# Patient Record
Sex: Female | Born: 1940 | Race: Black or African American | Hispanic: No | Marital: Single | Smoking: Former smoker
Health system: Southern US, Community
[De-identification: ages and names within clinical notes are randomized; demographics above are authoritative.]

## PROBLEM LIST (undated history)

## (undated) DIAGNOSIS — R7303 Prediabetes: Secondary | ICD-10-CM

## (undated) DIAGNOSIS — J45909 Unspecified asthma, uncomplicated: Secondary | ICD-10-CM

## (undated) DIAGNOSIS — R609 Edema, unspecified: Secondary | ICD-10-CM

## (undated) DIAGNOSIS — M109 Gout, unspecified: Secondary | ICD-10-CM

## (undated) DIAGNOSIS — I10 Essential (primary) hypertension: Secondary | ICD-10-CM

## (undated) HISTORY — DX: Unspecified asthma, uncomplicated: J45.909

## (undated) HISTORY — PX: ABDOMINAL HYSTERECTOMY: SHX81

---

## 1997-07-04 ENCOUNTER — Emergency Department (HOSPITAL_COMMUNITY): Admission: EM | Admit: 1997-07-04 | Discharge: 1997-07-04 | Payer: Self-pay | Admitting: Emergency Medicine

## 1998-05-13 ENCOUNTER — Other Ambulatory Visit: Admission: RE | Admit: 1998-05-13 | Discharge: 1998-05-13 | Payer: Self-pay | Admitting: Obstetrics and Gynecology

## 1998-07-15 ENCOUNTER — Emergency Department (HOSPITAL_COMMUNITY): Admission: EM | Admit: 1998-07-15 | Discharge: 1998-07-15 | Payer: Self-pay | Admitting: Emergency Medicine

## 1998-07-16 ENCOUNTER — Encounter: Payer: Self-pay | Admitting: Emergency Medicine

## 1999-11-14 ENCOUNTER — Encounter: Payer: Self-pay | Admitting: Obstetrics and Gynecology

## 1999-11-14 ENCOUNTER — Encounter: Admission: RE | Admit: 1999-11-14 | Discharge: 1999-11-14 | Payer: Self-pay | Admitting: Obstetrics and Gynecology

## 2000-04-25 ENCOUNTER — Other Ambulatory Visit: Admission: RE | Admit: 2000-04-25 | Discharge: 2000-04-25 | Payer: Self-pay | Admitting: Obstetrics and Gynecology

## 2000-07-26 ENCOUNTER — Encounter: Admission: RE | Admit: 2000-07-26 | Discharge: 2000-07-26 | Payer: Self-pay | Admitting: *Deleted

## 2000-07-26 ENCOUNTER — Encounter: Payer: Self-pay | Admitting: *Deleted

## 2000-12-20 ENCOUNTER — Emergency Department (HOSPITAL_COMMUNITY): Admission: EM | Admit: 2000-12-20 | Discharge: 2000-12-21 | Payer: Self-pay | Admitting: Emergency Medicine

## 2000-12-21 ENCOUNTER — Encounter: Payer: Self-pay | Admitting: Emergency Medicine

## 2005-08-13 ENCOUNTER — Emergency Department (HOSPITAL_COMMUNITY): Admission: EM | Admit: 2005-08-13 | Discharge: 2005-08-13 | Payer: Self-pay | Admitting: Emergency Medicine

## 2006-08-28 ENCOUNTER — Encounter: Admission: RE | Admit: 2006-08-28 | Discharge: 2006-08-28 | Payer: Self-pay | Admitting: Internal Medicine

## 2008-01-31 ENCOUNTER — Emergency Department (HOSPITAL_COMMUNITY): Admission: EM | Admit: 2008-01-31 | Discharge: 2008-01-31 | Payer: Self-pay | Admitting: Emergency Medicine

## 2009-02-18 ENCOUNTER — Encounter: Admission: RE | Admit: 2009-02-18 | Discharge: 2009-02-18 | Payer: Self-pay | Admitting: Internal Medicine

## 2009-02-24 ENCOUNTER — Encounter: Admission: RE | Admit: 2009-02-24 | Discharge: 2009-02-24 | Payer: Self-pay | Admitting: Internal Medicine

## 2010-04-11 ENCOUNTER — Other Ambulatory Visit: Payer: Self-pay | Admitting: Internal Medicine

## 2010-04-11 DIAGNOSIS — R221 Localized swelling, mass and lump, neck: Secondary | ICD-10-CM

## 2010-04-12 ENCOUNTER — Other Ambulatory Visit: Payer: Self-pay | Admitting: Internal Medicine

## 2010-04-12 DIAGNOSIS — Z1231 Encounter for screening mammogram for malignant neoplasm of breast: Secondary | ICD-10-CM

## 2010-04-19 ENCOUNTER — Ambulatory Visit: Payer: Self-pay

## 2010-04-19 ENCOUNTER — Ambulatory Visit
Admission: RE | Admit: 2010-04-19 | Discharge: 2010-04-19 | Disposition: A | Discharge status: Home / Self Care | Payer: Medicare Other | Source: Ambulatory Visit | Attending: Internal Medicine | Admitting: Internal Medicine

## 2010-04-19 ENCOUNTER — Other Ambulatory Visit: Payer: Self-pay | Admitting: Internal Medicine

## 2010-04-19 DIAGNOSIS — Z139 Encounter for screening, unspecified: Secondary | ICD-10-CM

## 2010-04-19 DIAGNOSIS — R221 Localized swelling, mass and lump, neck: Secondary | ICD-10-CM

## 2010-07-12 ENCOUNTER — Emergency Department (HOSPITAL_COMMUNITY)
Admission: EM | Admit: 2010-07-12 | Discharge: 2010-07-12 | Disposition: A | Discharge status: Home / Self Care | Payer: Medicare Other | Attending: Emergency Medicine | Admitting: Emergency Medicine

## 2010-07-12 ENCOUNTER — Emergency Department (HOSPITAL_COMMUNITY): Payer: Medicare Other

## 2010-07-12 DIAGNOSIS — R51 Headache: Secondary | ICD-10-CM | POA: Insufficient documentation

## 2010-07-12 DIAGNOSIS — I10 Essential (primary) hypertension: Secondary | ICD-10-CM | POA: Insufficient documentation

## 2010-07-12 DIAGNOSIS — H81319 Aural vertigo, unspecified ear: Secondary | ICD-10-CM | POA: Insufficient documentation

## 2010-07-12 LAB — CBC
HCT: 37.9 % (ref 36.0–46.0)
Hemoglobin: 12.1 g/dL (ref 12.0–15.0)
MCH: 29.4 pg (ref 26.0–34.0)
MCHC: 31.9 g/dL (ref 30.0–36.0)
MCV: 92.2 fL (ref 78.0–100.0)
Platelets: 248 10*3/uL (ref 150–400)
RBC: 4.11 MIL/uL (ref 3.87–5.11)
RDW: 13.3 % (ref 11.5–15.5)
WBC: 7.5 10*3/uL (ref 4.0–10.5)

## 2010-07-12 LAB — DIFFERENTIAL
Basophils Absolute: 0 10*3/uL (ref 0.0–0.1)
Basophils Relative: 0 % (ref 0–1)
Eosinophils Absolute: 0.1 10*3/uL (ref 0.0–0.7)
Eosinophils Relative: 1 % (ref 0–5)
Lymphocytes Relative: 16 % (ref 12–46)
Lymphs Abs: 1.2 10*3/uL (ref 0.7–4.0)
Monocytes Absolute: 0.5 10*3/uL (ref 0.1–1.0)
Monocytes Relative: 7 % (ref 3–12)
Neutro Abs: 5.7 10*3/uL (ref 1.7–7.7)
Neutrophils Relative %: 77 % (ref 43–77)

## 2010-07-12 LAB — URINALYSIS, ROUTINE W REFLEX MICROSCOPIC
Bilirubin Urine: NEGATIVE
Glucose, UA: NEGATIVE mg/dL
Hgb urine dipstick: NEGATIVE
Ketones, ur: NEGATIVE mg/dL
Leukocytes, UA: NEGATIVE
Nitrite: NEGATIVE
Protein, ur: NEGATIVE mg/dL
Specific Gravity, Urine: 1.006 (ref 1.005–1.030)
Urobilinogen, UA: 0.2 mg/dL (ref 0.0–1.0)
pH: 7.5 (ref 5.0–8.0)

## 2010-07-12 LAB — POCT I-STAT, CHEM 8
BUN: 11 mg/dL (ref 6–23)
Calcium, Ion: 1.15 mmol/L (ref 1.12–1.32)
Chloride: 101 mEq/L (ref 96–112)
Creatinine, Ser: 1 mg/dL (ref 0.50–1.10)
Glucose, Bld: 104 mg/dL — ABNORMAL HIGH (ref 70–99)
HCT: 40 % (ref 36.0–46.0)
Hemoglobin: 13.6 g/dL (ref 12.0–15.0)
Potassium: 3.8 mEq/L (ref 3.5–5.1)
Sodium: 141 mEq/L (ref 135–145)
TCO2: 31 mmol/L (ref 0–100)

## 2011-09-12 ENCOUNTER — Ambulatory Visit: Payer: Self-pay | Admitting: Obstetrics and Gynecology

## 2014-01-04 ENCOUNTER — Emergency Department (HOSPITAL_COMMUNITY)
Admission: EM | Admit: 2014-01-04 | Discharge: 2014-01-04 | Disposition: A | Discharge status: Home / Self Care | Payer: PRIVATE HEALTH INSURANCE | Attending: Emergency Medicine | Admitting: Emergency Medicine

## 2014-01-04 ENCOUNTER — Encounter (HOSPITAL_COMMUNITY): Payer: Self-pay

## 2014-01-04 ENCOUNTER — Emergency Department (HOSPITAL_COMMUNITY): Payer: PRIVATE HEALTH INSURANCE

## 2014-01-04 DIAGNOSIS — I10 Essential (primary) hypertension: Secondary | ICD-10-CM | POA: Diagnosis not present

## 2014-01-04 DIAGNOSIS — R05 Cough: Secondary | ICD-10-CM

## 2014-01-04 DIAGNOSIS — J209 Acute bronchitis, unspecified: Secondary | ICD-10-CM

## 2014-01-04 DIAGNOSIS — Z87891 Personal history of nicotine dependence: Secondary | ICD-10-CM | POA: Insufficient documentation

## 2014-01-04 DIAGNOSIS — R059 Cough, unspecified: Secondary | ICD-10-CM

## 2014-01-04 HISTORY — DX: Essential (primary) hypertension: I10

## 2014-01-04 LAB — CBC WITH DIFFERENTIAL/PLATELET
Basophils Absolute: 0 10*3/uL (ref 0.0–0.1)
Basophils Relative: 0 % (ref 0–1)
Eosinophils Absolute: 0.1 10*3/uL (ref 0.0–0.7)
Eosinophils Relative: 1 % (ref 0–5)
HCT: 39.4 % (ref 36.0–46.0)
Hemoglobin: 12.8 g/dL (ref 12.0–15.0)
Lymphocytes Relative: 25 % (ref 12–46)
Lymphs Abs: 1.6 10*3/uL (ref 0.7–4.0)
MCH: 30.3 pg (ref 26.0–34.0)
MCHC: 32.5 g/dL (ref 30.0–36.0)
MCV: 93.1 fL (ref 78.0–100.0)
Monocytes Absolute: 0.6 10*3/uL (ref 0.1–1.0)
Monocytes Relative: 9 % (ref 3–12)
Neutro Abs: 4.1 10*3/uL (ref 1.7–7.7)
Neutrophils Relative %: 65 % (ref 43–77)
Platelets: 190 10*3/uL (ref 150–400)
RBC: 4.23 MIL/uL (ref 3.87–5.11)
RDW: 13.4 % (ref 11.5–15.5)
WBC: 6.4 10*3/uL (ref 4.0–10.5)

## 2014-01-04 LAB — BASIC METABOLIC PANEL
Anion gap: 8 (ref 5–15)
BUN: 12 mg/dL (ref 6–23)
CO2: 31 mmol/L (ref 19–32)
Calcium: 9 mg/dL (ref 8.4–10.5)
Chloride: 102 mEq/L (ref 96–112)
Creatinine, Ser: 0.92 mg/dL (ref 0.50–1.10)
GFR calc Af Amer: 70 mL/min — ABNORMAL LOW (ref >90)
GFR calc non Af Amer: 61 mL/min — ABNORMAL LOW (ref >90)
Glucose, Bld: 112 mg/dL — ABNORMAL HIGH (ref 70–99)
Potassium: 3.3 mmol/L — ABNORMAL LOW (ref 3.5–5.1)
Sodium: 141 mmol/L (ref 135–145)

## 2014-01-04 LAB — I-STAT CG4 LACTIC ACID, ED: Lactic Acid, Venous: 0.56 mmol/L (ref 0.5–2.2)

## 2014-01-04 MED ORDER — SODIUM CHLORIDE 0.9 % IV SOLN
INTRAVENOUS | Status: DC
  Administered 2014-01-04: 15:00:00 via INTRAVENOUS

## 2014-01-04 MED ORDER — PREDNISONE 20 MG PO TABS
60.0000 mg | ORAL_TABLET | Freq: Once | ORAL | Status: AC
  Administered 2014-01-04: 60 mg via ORAL
  Filled 2014-01-04: qty 3

## 2014-01-04 MED ORDER — HYDROCODONE-ACETAMINOPHEN 5-325 MG PO TABS: ORAL_TABLET | ORAL | Status: DC

## 2014-01-04 MED ORDER — ALBUTEROL SULFATE HFA 108 (90 BASE) MCG/ACT IN AERS
2.0000 | INHALATION_SPRAY | Freq: Once | RESPIRATORY_TRACT | Status: AC
Start: 2014-01-04 — End: 2014-01-04
  Administered 2014-01-04: 2 via RESPIRATORY_TRACT
  Filled 2014-01-04: qty 6.7

## 2014-01-04 MED ORDER — AZITHROMYCIN 250 MG PO TABS: ORAL_TABLET | ORAL | Status: DC

## 2014-01-04 MED ORDER — IPRATROPIUM BROMIDE 0.02 % IN SOLN
0.5000 mg | Freq: Once | RESPIRATORY_TRACT | Status: AC
  Administered 2014-01-04: 0.5 mg via RESPIRATORY_TRACT
  Filled 2014-01-04: qty 2.5

## 2014-01-04 MED ORDER — PREDNISONE 50 MG PO TABS: ORAL_TABLET | ORAL | Status: DC

## 2014-01-04 MED ORDER — HYDROCOD POLST-CHLORPHEN POLST 10-8 MG/5ML PO LQCR
5.0000 mL | Freq: Once | ORAL | Status: AC
  Administered 2014-01-04: 5 mL via ORAL
  Filled 2014-01-04: qty 5

## 2014-01-04 NOTE — ED Provider Notes (Signed)
CSN: [id number]     Arrival date & time 01/04/14  1308 History  This chart was scribed for non-physician practitioner, Sherry EmeryNicole Kalinda Romaniello, PA-C, working with Sherry MochaBlair Walden, MD, by Sherry Wood, ED Scribe. This patient was seen in room WTR6/WTR6 and the patient's care was started at 2:19 PM.    Chief Complaint  Patient presents with  . Cough    The history is provided by the patient. No language interpreter was used.    HPI Comments: Sherry Wood is a 73 y.o. female who presents to the Emergency Department complaining of a productive cough that began 4 days ago. She initially felt dizzy the day before; the next day, she began to cough and have "hot sweats." A couple days later, patient had one episode of epistaxis. She complains of associated chest congestion, rhinorrhea, reduced ability to taste, and pain on "the roof of her mouth" that has resolved. She endorses some nausea and one episode of vomiting following ingestion of ginger ale. She denies blood thinner use. She denies chest pain, SOB, fever, and chills. Her son drove her to the ED here today.   Past Medical History  Diagnosis Date  . Hypertension    Past Surgical History  Procedure Laterality Date  . Abdominal hysterectomy     History reviewed. No pertinent family history. History  Substance Use Topics  . Smoking status: Former Games developermoker  . Smokeless tobacco: Not on file  . Alcohol Use: No   OB History    No data available     Review of Systems  10 systems reviewed and found to be negative, except as noted in the HPI.   Allergies  Review of patient's allergies indicates no known allergies.  Home Medications   Prior to Admission medications   Medication Sig Start Date End Date Taking? Authorizing Provider  azithromycin (ZITHROMAX Z-PAK) 250 MG tablet 2 po day one, then 1 daily x 4 days 01/04/14   Sherry ReiningNicole Satonya Lux, PA-C  HYDROcodone-acetaminophen (NORCO/VICODIN) 5-325 MG per tablet Take 1-2 tablets by mouth every 6  hours as needed for pain and/or cough. 01/04/14   Sherry Lute, PA-C  predniSONE (DELTASONE) 50 MG tablet Take 1 tablet daily with breakfast 01/04/14   Sherry Keadle, PA-C   BP 133/97 mmHg  Pulse 72  Temp(Src) 98.7 F (37.1 C) (Oral)  Resp 20  SpO2 95% Physical Exam  Constitutional: She is oriented to person, place, and time. She appears well-developed and well-nourished. No distress.  HENT:  Head: Normocephalic and atraumatic.  Mouth/Throat: Oropharynx is clear and moist.  Eyes: Conjunctivae and EOM are normal. Pupils are equal, round, and reactive to light.  Neck: Normal range of motion. Neck supple. No tracheal deviation present.  Cardiovascular: Normal rate, regular rhythm and intact distal pulses.   Pulmonary/Chest: Effort normal and breath sounds normal. No respiratory distress. She has no wheezes. She has no rales. She exhibits no tenderness.  Abdominal: Soft. Bowel sounds are normal. She exhibits no distension and no mass. There is no tenderness. There is no rebound and no guarding.  Musculoskeletal: Normal range of motion. She exhibits no tenderness.  Neurological: She is alert and oriented to person, place, and time.  Skin: Skin is warm and dry.  Psychiatric: She has a normal mood and affect. Her behavior is normal.  Nursing note and vitals reviewed.   ED Course  Procedures (including critical care time)  DIAGNOSTIC STUDIES: Oxygen Saturation is 94% on room air, adequate by my interpretation.    COORDINATION  OF CARE: 2:22 PM - Discussed treatment plan with pt at bedside which includes blood tests, IV fluids, and narcotic cough medication, and pt agreed to plan.   Labs Review Labs Reviewed  BASIC METABOLIC PANEL - Abnormal; Notable for the following:    Potassium 3.3 (*)    Glucose, Bld 112 (*)    GFR calc non Af Amer 61 (*)    GFR calc Af Amer 70 (*)    All other components within normal limits  CBC WITH DIFFERENTIAL  I-STAT CG4 LACTIC ACID, ED     Imaging Review Dg Chest 2 View  01/04/2014   CLINICAL DATA:  Productive cough for 5 days, shortness of breath  EXAM: CHEST  2 VIEW  COMPARISON:  08/13/2005  FINDINGS: The heart size and mediastinal contours are within normal limits. Both lungs are clear. The visualized skeletal structures are unremarkable.  IMPRESSION: No active cardiopulmonary disease.   Electronically Signed   By: Elige KoHetal  Patel   On: 01/04/2014 14:02     EKG Interpretation None      MDM   Final diagnoses:  Acute bronchitis, unspecified organism   Filed Vitals:   01/04/14 1339 01/04/14 1413 01/04/14 1600 01/04/14 1649  BP: 133/97     Pulse: 78  67 72  Temp: 98.7 F (37.1 C)     TempSrc: Oral     Resp:  16  20  SpO2: 94%  91% 95%    Medications  0.9 %  sodium chloride infusion ( Intravenous Stopped 01/04/14 1701)  chlorpheniramine-HYDROcodone (TUSSIONEX) 10-8 MG/5ML suspension 5 mL (5 mLs Oral Given 01/04/14 1455)  predniSONE (DELTASONE) tablet 60 mg (60 mg Oral Given 01/04/14 1617)  ipratropium (ATROVENT) nebulizer solution 0.5 mg (0.5 mg Nebulization Given 01/04/14 1618)  albuterol (PROVENTIL HFA;VENTOLIN HFA) 108 (90 BASE) MCG/ACT inhaler 2 puff (2 puffs Inhalation Given 01/04/14 1704)    Sherry Wood is a pleasant 73 y.o. female presenting with productive cough, subjective fever and chills. Chest x-ray does not show any infiltrate. Patient has no leukocytosis, lactic acid is normal. Will treat for acute bronchitis.  This is a shared visit with the attending physician who personally evaluated the patient and agrees with the care plan.   At discharge patient is found to have a oxygen saturation of 89-90% while resting. She will be given prednisone (she is not a diabetic) albuterol nebulizer treatment will ambulate her with pulse ox determine if her hypoxia is appropriate for her to be discharged home.  Pulse ox remained steady at 95% while patient ambulated.  Evaluation does not show pathology that  would require ongoing emergent intervention or inpatient treatment. Pt is hemodynamically stable and mentating appropriately. Discussed findings and plan with patient/guardian, who agrees with care plan. All questions answered. Return precautions discussed and outpatient follow up given.   Discharge Medication List as of 01/04/2014  3:55 PM    START taking these medications   Details  azithromycin (ZITHROMAX Z-PAK) 250 MG tablet 2 po day one, then 1 daily x 4 days, Print    HYDROcodone-acetaminophen (NORCO/VICODIN) 5-325 MG per tablet Take 1-2 tablets by mouth every 6 hours as needed for pain and/or cough., Print         I personally performed the services described in this documentation, which was scribed in my presence. The recorded information has been reviewed and is accurate.       Sherry Emeryicole Dellar Traber, PA-C 01/04/14 1813  Sherry MochaBlair Walden, MD 01/05/14 [phone]

## 2014-01-04 NOTE — ED Notes (Signed)
[facility], infusing well

## 2014-01-04 NOTE — ED Notes (Signed)
PA advised of persistent O2 sat of 89-91. O2 2l initiated

## 2014-01-04 NOTE — ED Notes (Signed)
Cough/congestion since Thursday.  Unknown for fever.  White sputum

## 2014-01-04 NOTE — Discharge Instructions (Signed)
Take vicodin for breakthrough pain, do not drink alcohol, drive, care for children or do other critical tasks while taking vicodin. ° °Please be very careful not to fall! °The pain medication puts you at risk for falls. Please rest as much as possible and try to not stay alone.  ° °Please follow with your primary care doctor in the next 2 days for a check-up. They must obtain records for further management.  ° °Do not hesitate to return to the Emergency Department for any new, worsening or concerning symptoms.  ° ° °

## 2015-03-23 ENCOUNTER — Other Ambulatory Visit: Payer: Self-pay | Admitting: Family Medicine

## 2015-03-23 DIAGNOSIS — Z1231 Encounter for screening mammogram for malignant neoplasm of breast: Secondary | ICD-10-CM

## 2015-04-19 ENCOUNTER — Other Ambulatory Visit: Payer: Self-pay

## 2015-04-19 DIAGNOSIS — Z1231 Encounter for screening mammogram for malignant neoplasm of breast: Secondary | ICD-10-CM

## 2015-05-06 ENCOUNTER — Ambulatory Visit
Admission: RE | Admit: 2015-05-06 | Discharge: 2015-05-06 | Disposition: A | Discharge status: Home / Self Care | Payer: Medicare Other | Source: Ambulatory Visit

## 2015-05-06 DIAGNOSIS — Z1231 Encounter for screening mammogram for malignant neoplasm of breast: Secondary | ICD-10-CM

## 2015-08-25 ENCOUNTER — Other Ambulatory Visit (INDEPENDENT_AMBULATORY_CARE_PROVIDER_SITE_OTHER): Payer: Self-pay | Admitting: Otolaryngology

## 2015-08-25 DIAGNOSIS — K118 Other diseases of salivary glands: Secondary | ICD-10-CM

## 2015-09-05 ENCOUNTER — Ambulatory Visit
Admission: RE | Admit: 2015-09-05 | Discharge: 2015-09-05 | Disposition: A | Discharge status: Home / Self Care | Payer: Medicare Other | Source: Ambulatory Visit | Attending: Otolaryngology | Admitting: Otolaryngology

## 2015-09-05 DIAGNOSIS — K118 Other diseases of salivary glands: Secondary | ICD-10-CM

## 2015-09-05 MED ORDER — IOPAMIDOL (ISOVUE-300) INJECTION 61%
75.0000 mL | Freq: Once | INTRAVENOUS | Status: AC | PRN
  Administered 2015-09-05: 75 mL via INTRAVENOUS

## 2016-04-13 ENCOUNTER — Other Ambulatory Visit: Payer: Self-pay | Admitting: Family Medicine

## 2016-04-13 DIAGNOSIS — R5381 Other malaise: Secondary | ICD-10-CM

## 2016-04-13 DIAGNOSIS — Z1231 Encounter for screening mammogram for malignant neoplasm of breast: Secondary | ICD-10-CM

## 2016-04-18 ENCOUNTER — Other Ambulatory Visit: Payer: Self-pay | Admitting: Family Medicine

## 2016-04-18 DIAGNOSIS — E2839 Other primary ovarian failure: Secondary | ICD-10-CM

## 2016-05-25 ENCOUNTER — Ambulatory Visit: Payer: Medicare Other

## 2016-05-25 ENCOUNTER — Other Ambulatory Visit: Payer: Medicare Other

## 2016-06-07 ENCOUNTER — Other Ambulatory Visit: Payer: Medicare Other

## 2016-06-07 ENCOUNTER — Ambulatory Visit: Payer: Medicare Other

## 2016-06-08 ENCOUNTER — Ambulatory Visit
Admission: RE | Admit: 2016-06-08 | Discharge: 2016-06-08 | Disposition: A | Discharge status: Home / Self Care | Payer: Medicare Other | Source: Ambulatory Visit | Attending: Family Medicine | Admitting: Family Medicine

## 2016-06-08 DIAGNOSIS — Z1231 Encounter for screening mammogram for malignant neoplasm of breast: Secondary | ICD-10-CM

## 2016-06-08 DIAGNOSIS — E2839 Other primary ovarian failure: Secondary | ICD-10-CM

## 2016-10-11 ENCOUNTER — Emergency Department (HOSPITAL_COMMUNITY)
Admission: EM | Admit: 2016-10-11 | Discharge: 2016-10-11 | Disposition: A | Discharge status: Home / Self Care | Payer: Medicare Other | Attending: Emergency Medicine | Admitting: Emergency Medicine

## 2016-10-11 ENCOUNTER — Encounter (HOSPITAL_COMMUNITY): Payer: Self-pay | Admitting: Emergency Medicine

## 2016-10-11 DIAGNOSIS — Z87891 Personal history of nicotine dependence: Secondary | ICD-10-CM | POA: Diagnosis not present

## 2016-10-11 DIAGNOSIS — Z79899 Other long term (current) drug therapy: Secondary | ICD-10-CM | POA: Diagnosis not present

## 2016-10-11 DIAGNOSIS — M25461 Effusion, right knee: Secondary | ICD-10-CM | POA: Diagnosis not present

## 2016-10-11 DIAGNOSIS — Z91040 Latex allergy status: Secondary | ICD-10-CM | POA: Insufficient documentation

## 2016-10-11 DIAGNOSIS — I10 Essential (primary) hypertension: Secondary | ICD-10-CM | POA: Insufficient documentation

## 2016-10-11 DIAGNOSIS — M25561 Pain in right knee: Secondary | ICD-10-CM | POA: Diagnosis present

## 2016-10-11 NOTE — Discharge Instructions (Signed)
You may take over-the-counter medicine for symptomatic relief, such as Tylenol, or Motrin. Please limit acetaminophen (Tylenol) to 4000 mg and Ibuprofen (Motrin, Advil, etc.) to 2400 mg for a 24hr period. Please note that other over-the-counter medicine may contain acetaminophen or ibuprofen as a component of their ingredients.    

## 2016-10-11 NOTE — ED Triage Notes (Signed)
Pt with R knee and RLE pain x several days. Pt denies injury. States pain started behind the knee and medial side of knee.Pt has attempted OTC pain meds, no relief. Pt states pain and swelling increases after ambulation.

## 2016-10-11 NOTE — ED Provider Notes (Signed)
[facility]-EMERGENCY DEPT Provider Note   CSN: [id number] Arrival date & time: 10/11/16  1251     History   Chief Complaint Chief Complaint  Patient presents with  . Knee Pain    HPI Sherry Wood is a 76 y.o. female.  The history is provided by the patient.  Knee Pain   This is a new problem. Episode onset: 1-2 weeks. The problem occurs constantly. The pain is present in the right knee. The quality of the pain is described as aching. The pain is moderate. Pertinent negatives include no numbness, full range of motion, no stiffness, no tingling and no itching. She has tried OTC pain medications for the symptoms. The treatment provided moderate relief. There has been no history of extremity trauma.   Pt reports that she Has been taking care of her 56-year-old grandson that likes to "ride" on her leg like a horse while she rapidly flexes and extends her leg at the knee.  Past Medical History:  Diagnosis Date  . Hypertension     There are no active problems to display for this patient.   Past Surgical History:  Procedure Laterality Date  . ABDOMINAL HYSTERECTOMY      OB History    No data available       Home Medications    Prior to Admission medications   Medication Sig Start Date End Date Taking? Authorizing Provider  furosemide (LASIX) 20 MG tablet Take 20 mg by mouth daily. 12/13/15  Yes [provider]  metoprolol tartrate (LOPRESSOR) 100 MG tablet Take 100 mg by mouth 2 (two) times daily. 07/03/16  Yes [provider]  azithromycin (ZITHROMAX Z-PAK) 250 MG tablet 2 po day one, then 1 daily x 4 days Patient not taking: Reported on 10/11/2016 01/04/14   Pisciotta, Joni Reining, PA-C  HYDROcodone-acetaminophen (NORCO/VICODIN) 5-325 MG per tablet Take 1-2 tablets by mouth every 6 hours as needed for pain and/or cough. Patient not taking: Reported on 10/11/2016 01/04/14   Pisciotta, Joni Reining, PA-C  predniSONE (DELTASONE) 50 MG tablet Take 1 tablet daily with  breakfast Patient not taking: Reported on 10/11/2016 01/04/14   Pisciotta, Mardella Layman    Family History History reviewed. No pertinent family history.  Social History Social History  Substance Use Topics  . Smoking status: Former Games developer  . Smokeless tobacco: Not on file  . Alcohol use No     Allergies   Aloe vera; Latex; Naproxen; and Penicillins   Review of Systems Review of Systems  Musculoskeletal: Negative for stiffness.  Skin: Negative for itching.  Neurological: Negative for tingling and numbness.     Physical Exam Updated Vital Signs BP (!) 163/95 (BP Location: Left Arm)   Pulse 68   Temp 98.3 F (36.8 C) (Oral)   Resp 18   Ht  (1.6 m)   Wt 86.2 kg (190 lb)   SpO2 95%   BMI 33.66 kg/m   Physical Exam  Constitutional: She is oriented to person, place, and time. She appears well-developed and well-nourished. No distress.  HENT:  Head: Normocephalic and atraumatic.  Right Ear: External ear normal.  Left Ear: External ear normal.  Nose: Nose normal.  Eyes: Conjunctivae and EOM are normal. No scleral icterus.  Neck: Normal range of motion and phonation normal.  Cardiovascular: Normal rate and regular rhythm.   Pulmonary/Chest: Effort normal. No stridor. No respiratory distress.  Abdominal: She exhibits no distension.  Musculoskeletal: Normal range of motion. She exhibits no edema.  Right knee: She exhibits effusion. She exhibits normal range of motion, no ecchymosis, no deformity, no erythema, no LCL laxity, normal patellar mobility, no bony tenderness and no MCL laxity. No tenderness found. No medial joint line and no lateral joint line tenderness noted.  Neurological: She is alert and oriented to person, place, and time.  Skin: She is not diaphoretic.  Psychiatric: She has a normal mood and affect. Her behavior is normal.  Vitals reviewed.    ED Treatments / Results  Labs (all labs ordered are listed, but only abnormal results are  displayed) Labs Reviewed - No data to display  EKG  EKG Interpretation None       Radiology No results found.  Procedures Procedures (including critical care time)  Medications Ordered in ED Medications - No data to display   Initial Impression / Assessment and Plan / ED Course  I have reviewed the triage vital signs and the nursing notes.  Pertinent labs & imaging results that were available during my care of the patient were reviewed by me and considered in my medical decision making (see chart for details).     Evidence of knee effusion likely secondary to overuse. Patient denied any trauma. No fevers. Low suspicion of septic arthritis or gout. Low suspicion for DVT. No indication for imaging at this time. Recommend over-the-counter medicine.  The patient is safe for discharge with strict return precautions.   Final Clinical Impressions(s) / ED Diagnoses   Final diagnoses:  Effusion of right knee   Disposition: Discharge  Condition: Good  I have discussed the results, Dx and Tx plan with the patient who expressed understanding and agree(s) with the plan. Discharge instructions discussed at great length. The patient was given strict return precautions who verbalized understanding of the instructions. No further questions at time of discharge.    New Prescriptions   No medications on file    Follow Up: Macy Mis, MD [address] [place] [address] [phone]  Schedule an appointment as soon as possible for a visit in 2 weeks If symptoms do not improve or  worsen      Cardama, Amadeo Garnet, MD 10/11/16 1713

## 2016-10-11 NOTE — ED Notes (Signed)
Pt ambulatory and independent at discharge.  Verbalized understanding of discharge instructions 

## 2017-09-25 ENCOUNTER — Other Ambulatory Visit: Payer: Self-pay | Admitting: Family Medicine

## 2017-09-25 DIAGNOSIS — Z1231 Encounter for screening mammogram for malignant neoplasm of breast: Secondary | ICD-10-CM

## 2017-10-22 ENCOUNTER — Ambulatory Visit
Admission: RE | Admit: 2017-10-22 | Discharge: 2017-10-22 | Disposition: A | Discharge status: Home / Self Care | Payer: Medicare Other | Source: Ambulatory Visit | Attending: Family Medicine | Admitting: Family Medicine

## 2017-10-22 DIAGNOSIS — Z1231 Encounter for screening mammogram for malignant neoplasm of breast: Secondary | ICD-10-CM

## 2019-08-27 ENCOUNTER — Encounter: Payer: Self-pay | Admitting: *Deleted

## 2019-09-02 ENCOUNTER — Ambulatory Visit (INDEPENDENT_AMBULATORY_CARE_PROVIDER_SITE_OTHER): Payer: Medicare PPO | Admitting: Obstetrics & Gynecology

## 2019-09-02 ENCOUNTER — Encounter: Payer: Self-pay | Admitting: Obstetrics & Gynecology

## 2019-09-02 ENCOUNTER — Other Ambulatory Visit: Payer: Self-pay

## 2019-09-02 VITALS — BP 145/98 | HR 67 | Wt 190.1 lb

## 2019-09-02 DIAGNOSIS — L900 Lichen sclerosus et atrophicus: Secondary | ICD-10-CM

## 2019-09-02 DIAGNOSIS — M7918 Myalgia, other site: Secondary | ICD-10-CM | POA: Diagnosis not present

## 2019-09-02 MED ORDER — CLOBETASOL PROPIONATE 0.05 % EX OINT: 1.0000 "application " | TOPICAL_OINTMENT | Freq: Two times a day (BID) | CUTANEOUS | 0 refills | Status: DC

## 2019-09-02 MED ORDER — DICLOFENAC SODIUM 1 % EX GEL: 2.0000 g | Freq: Four times a day (QID) | CUTANEOUS | 1 refills | Status: DC

## 2019-09-02 NOTE — Patient Instructions (Signed)
Lichen Sclerosus Lichen sclerosus is a skin problem. It can happen on any part of the body. It happens most often in the anal or genital areas. It can cause itching and discomfort. Treatment can help to control symptoms. It can also help prevent scarring that may lead to other problems. What are the causes? The cause of this condition is not known. It is not passed from one person to another (not contagious). What increases the risk? This condition is more likely to develop in women. It most often occurs after menopause. What are the signs or symptoms? Symptoms of this condition include:  Thin, wrinkled, white areas on the skin.  Thickened white areas on the skin.  Red and swollen patches (lesions) on the skin.  Tears or cracks in the skin.  Bruising.  Blood blisters.  Very bad itching.  Pain, itching, or burning when peeing (urinating).  Trouble pooping (constipation). How is this diagnosed? This condition may be diagnosed with a physical exam. A sample of your skin may also be removed to be looked at under a microscope (biopsy). How is this treated? This condition may be treated with:  Creams or ointments (topical steroids) that are put on the skin in the affected areas. This is the most common treatment.  Medicines that are taken by mouth.  Surgery. This is only needed if the condition is very bad and is causing problems such as scarring. Follow these instructions at home:  Take or use over-the-counter and prescription medicines only as told by your doctor.  Use creams or ointments as told by your doctor.  Do not scratch the affected areas of skin.  If you are a woman, keep the vagina as clean and dry as you can.  Clean the affected area of skin gently with water. Avoid using rough towels or toilet paper.  Keep all follow-up visits as told by your doctor. This is important. Contact a doctor if:  Your redness, swelling, or pain gets worse.  You have fluid,  blood, or pus coming from the area.  You have new red and swollen patches on your skin.  You have a fever.  You have pain during sex. Summary  Lichen sclerosus is a skin problem. It can cause itching and discomfort.  This condition is usually treated with creams or ointments that are put on the skin in the affected areas.  Use medicines only as told by your doctor.  Do not scratch the affected areas of skin.  Keep all follow-up visits as told by your doctor. This is important. This information is not intended to replace advice given to you by your health care provider. Make sure you discuss any questions you have with your health care provider. Document Revised: 05/09/2017 Document Reviewed: 05/09/2017 Elsevier Patient Education  2020 [employer]  

## 2019-09-02 NOTE — Progress Notes (Signed)
Sherry Wood is an 79 y.o. female. G6P6 presents with worsened abd/pelvic pain and vulvar irritation. Regarding pain chronic worsened since 2018, dull aching, non radiating, noted in groin and back, not affected by movement, worsened when sitting standing for long periods of time, worsened as day progresses. Last imaging CTAP in 11/2018 was negative for pathology for pain. Denies vaginal bleeding, weight loss, early satiety. Family h/o breast, ovarian CA.  Vulvar itching/irritation for past few months intermittent, antifungal creams has not helped.   Pertinent Gynecological History: Menses: post-menopausal Contraception: status post hysterectomy DES exposure: unknown Blood transfusions: none Sexually transmitted diseases: no past history Previous GYN Procedures: none  OB History: G6, P6006   Menstrual History: No LMP recorded. Patient has had a hysterectomy.    Past Medical History:  Diagnosis Date  . Asthma   . Hypertension     Past Surgical History:  Procedure Laterality Date  . ABDOMINAL HYSTERECTOMY      Family History  Problem Relation Age of Onset  . Diabetes Mother     Social History:  reports that she has quit smoking. Her smoking use included cigarettes. She has never used smokeless tobacco. She reports that she does not drink alcohol and does not use drugs.  Allergies:  Allergies  Allergen Reactions  . Aloe Vera Rash  . Latex Rash  . Naproxen Nausea And Vomiting  . Penicillins Nausea Only    Has patient had a PCN reaction causing immediate rash, facial/tongue/throat swelling, SOB or lightheadedness with hypotension: Unknown Has patient had a PCN reaction causing severe rash involving mucus membranes or skin necrosis: unknown Has patient had a PCN reaction that required hospitalization: unknown Has patient had a PCN reaction occurring within the last 10 years: No If all of the above answers are "NO", then may proceed with Cephalosporin use    (Not in a  hospital admission)   Review of Systems  Constitutional: Negative for activity change, appetite change, fatigue and unexpected weight change.  HENT: Negative.   Eyes: Negative.   Respiratory: Negative.  Negative for chest tightness, shortness of breath and wheezing.   Cardiovascular: Negative.  Negative for chest pain and leg swelling.  Gastrointestinal: Positive for abdominal pain. Negative for abdominal distention, constipation, diarrhea, nausea and vomiting.  Endocrine: Negative.   Genitourinary: Positive for vaginal pain. Negative for dysuria and hematuria.  Musculoskeletal: Positive for back pain.  Neurological: Negative.  Negative for dizziness, weakness, light-headedness, numbness and headaches.  Hematological: Negative.   Psychiatric/Behavioral: Negative.  Negative for agitation, confusion and decreased concentration.    Blood pressure (!) 145/98, pulse 67, weight 190 lb 1.6 oz (86.2 kg). Physical Exam Vitals reviewed.  Constitutional:      Appearance: She is well-developed.  HENT:     Head: Normocephalic and atraumatic.  Eyes:     Pupils: Pupils are equal, round, and reactive to light.  Cardiovascular:     Rate and Rhythm: Normal rate.  Pulmonary:     Effort: Pulmonary effort is normal.  Abdominal:     General: There is no distension.     Palpations: Abdomen is soft.     Tenderness: There is no abdominal tenderness. There is no guarding.  Genitourinary:    Vagina: No vaginal discharge.     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  Skin:    General: Skin is warm and dry.  Neurological:     Mental  Status: She is alert and oriented to person, place, and time.  Psychiatric:        Behavior: Behavior normal.     No results found for this or any previous visit (from the past 24 hour(s)).  No results found.  Assessment/Plan: 89TM L4Y5 with pelvic pain and vulvar  irritation. Abd/pelv pain: msk pain it most likely with start with voltaren and reassess in a few months.  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 Rx clobetasol   Malachy Chamber 09/02/2019, 11:36 AM

## 2019-10-28 ENCOUNTER — Other Ambulatory Visit: Payer: Self-pay | Admitting: Family Medicine

## 2019-10-28 DIAGNOSIS — Z1231 Encounter for screening mammogram for malignant neoplasm of breast: Secondary | ICD-10-CM

## 2019-10-29 ENCOUNTER — Ambulatory Visit: Payer: Medicare PPO

## 2019-10-29 ENCOUNTER — Ambulatory Visit
Admission: RE | Admit: 2019-10-29 | Discharge: 2019-10-29 | Disposition: A | Discharge status: Home / Self Care | Payer: Medicare HMO | Source: Ambulatory Visit | Attending: Family Medicine | Admitting: Family Medicine

## 2019-10-29 ENCOUNTER — Other Ambulatory Visit: Payer: Self-pay

## 2019-10-29 DIAGNOSIS — Z1231 Encounter for screening mammogram for malignant neoplasm of breast: Secondary | ICD-10-CM

## 2020-02-07 ENCOUNTER — Observation Stay (HOSPITAL_COMMUNITY)
Admission: EM | Admit: 2020-02-07 | Discharge: 2020-02-07 | Disposition: A | Discharge status: Home / Self Care | Payer: Medicare HMO | Attending: Family Medicine | Admitting: Family Medicine

## 2020-02-07 ENCOUNTER — Emergency Department (HOSPITAL_COMMUNITY): Payer: Medicare HMO

## 2020-02-07 ENCOUNTER — Other Ambulatory Visit: Payer: Self-pay

## 2020-02-07 ENCOUNTER — Observation Stay (HOSPITAL_BASED_OUTPATIENT_CLINIC_OR_DEPARTMENT_OTHER): Payer: Medicare HMO

## 2020-02-07 ENCOUNTER — Encounter (HOSPITAL_COMMUNITY): Payer: Self-pay

## 2020-02-07 DIAGNOSIS — Z91040 Latex allergy status: Secondary | ICD-10-CM | POA: Insufficient documentation

## 2020-02-07 DIAGNOSIS — R55 Syncope and collapse: Secondary | ICD-10-CM

## 2020-02-07 DIAGNOSIS — Z79899 Other long term (current) drug therapy: Secondary | ICD-10-CM | POA: Diagnosis not present

## 2020-02-07 DIAGNOSIS — R0689 Other abnormalities of breathing: Secondary | ICD-10-CM | POA: Diagnosis not present

## 2020-02-07 DIAGNOSIS — J45909 Unspecified asthma, uncomplicated: Secondary | ICD-10-CM | POA: Insufficient documentation

## 2020-02-07 DIAGNOSIS — Z20822 Contact with and (suspected) exposure to covid-19: Secondary | ICD-10-CM | POA: Insufficient documentation

## 2020-02-07 DIAGNOSIS — Z87891 Personal history of nicotine dependence: Secondary | ICD-10-CM | POA: Diagnosis not present

## 2020-02-07 DIAGNOSIS — J9612 Chronic respiratory failure with hypercapnia: Secondary | ICD-10-CM | POA: Diagnosis not present

## 2020-02-07 DIAGNOSIS — R0902 Hypoxemia: Secondary | ICD-10-CM

## 2020-02-07 DIAGNOSIS — E662 Morbid (severe) obesity with alveolar hypoventilation: Secondary | ICD-10-CM | POA: Diagnosis present

## 2020-02-07 DIAGNOSIS — E876 Hypokalemia: Secondary | ICD-10-CM

## 2020-02-07 DIAGNOSIS — R42 Dizziness and giddiness: Secondary | ICD-10-CM | POA: Diagnosis present

## 2020-02-07 DIAGNOSIS — I10 Essential (primary) hypertension: Secondary | ICD-10-CM | POA: Diagnosis not present

## 2020-02-07 HISTORY — DX: Edema, unspecified: R60.9

## 2020-02-07 HISTORY — DX: Prediabetes: R73.03

## 2020-02-07 HISTORY — DX: Gout, unspecified: M10.9

## 2020-02-07 LAB — BLOOD GAS, ARTERIAL
Acid-Base Excess: 4.7 mmol/L — ABNORMAL HIGH (ref 0.0–2.0)
Acid-Base Excess: 4.4 mmol/L — ABNORMAL HIGH (ref 0.0–2.0)
Bicarbonate: 32 mmol/L — ABNORMAL HIGH (ref 20.0–28.0)
Bicarbonate: 31 mmol/L — ABNORMAL HIGH (ref 20.0–28.0)
FIO2: 28
FIO2: 21
O2 Saturation: 97.5 %
O2 Saturation: 84.7 %
Patient temperature: 98.6
Patient temperature: 98.6
pCO2 arterial: 62.2 mmHg — ABNORMAL HIGH (ref 32.0–48.0)
pCO2 arterial: 56.5 mmHg — ABNORMAL HIGH (ref 32.0–48.0)
pH, Arterial: 7.331 — ABNORMAL LOW (ref 7.350–7.450)
pH, Arterial: 7.358 (ref 7.350–7.450)
pO2, Arterial: 101 mmHg (ref 83.0–108.0)
pO2, Arterial: 53.7 mmHg — ABNORMAL LOW (ref 83.0–108.0)

## 2020-02-07 LAB — URINALYSIS, ROUTINE W REFLEX MICROSCOPIC
Bilirubin Urine: NEGATIVE
Glucose, UA: NEGATIVE mg/dL
Hgb urine dipstick: NEGATIVE
Ketones, ur: NEGATIVE mg/dL
Leukocytes,Ua: NEGATIVE
Nitrite: NEGATIVE
Protein, ur: NEGATIVE mg/dL
Specific Gravity, Urine: 1.006 (ref 1.005–1.030)
pH: 8 (ref 5.0–8.0)

## 2020-02-07 LAB — D-DIMER, QUANTITATIVE: D-Dimer, Quant: 2.4 ug/mL-FEU — ABNORMAL HIGH (ref 0.00–0.50)

## 2020-02-07 LAB — CBC WITH DIFFERENTIAL/PLATELET
Abs Immature Granulocytes: 0.01 10*3/uL (ref 0.00–0.07)
Basophils Absolute: 0 10*3/uL (ref 0.0–0.1)
Basophils Relative: 0 %
Eosinophils Absolute: 0.1 10*3/uL (ref 0.0–0.5)
Eosinophils Relative: 1 %
HCT: 44.2 % (ref 36.0–46.0)
Hemoglobin: 13.9 g/dL (ref 12.0–15.0)
Immature Granulocytes: 0 %
Lymphocytes Relative: 14 %
Lymphs Abs: 1.1 10*3/uL (ref 0.7–4.0)
MCH: 30.9 pg (ref 26.0–34.0)
MCHC: 31.4 g/dL (ref 30.0–36.0)
MCV: 98.2 fL (ref 80.0–100.0)
Monocytes Absolute: 0.6 10*3/uL (ref 0.1–1.0)
Monocytes Relative: 8 %
Neutro Abs: 6 10*3/uL (ref 1.7–7.7)
Neutrophils Relative %: 77 %
Platelets: 189 10*3/uL (ref 150–400)
RBC: 4.5 MIL/uL (ref 3.87–5.11)
RDW: 13.6 % (ref 11.5–15.5)
WBC: 7.9 10*3/uL (ref 4.0–10.5)
nRBC: 0 % (ref 0.0–0.2)

## 2020-02-07 LAB — BRAIN NATRIURETIC PEPTIDE: B Natriuretic Peptide: 66.5 pg/mL (ref 0.0–100.0)

## 2020-02-07 LAB — ECHOCARDIOGRAM COMPLETE
Area-P 1/2: 2.07 cm2
Height: 61 in
S' Lateral: 2.8 cm
Weight: 3040 oz

## 2020-02-07 LAB — BASIC METABOLIC PANEL
Anion gap: 9 (ref 5–15)
BUN: 13 mg/dL (ref 8–23)
CO2: 30 mmol/L (ref 22–32)
Calcium: 8.8 mg/dL — ABNORMAL LOW (ref 8.9–10.3)
Chloride: 99 mmol/L (ref 98–111)
Creatinine, Ser: 0.86 mg/dL (ref 0.44–1.00)
GFR, Estimated: >60 mL/min (ref >60)
Glucose, Bld: 122 mg/dL — ABNORMAL HIGH (ref 70–99)
Potassium: 3.3 mmol/L — ABNORMAL LOW (ref 3.5–5.1)
Sodium: 138 mmol/L (ref 135–145)

## 2020-02-07 LAB — MAGNESIUM: Magnesium: 2.2 mg/dL (ref 1.7–2.4)

## 2020-02-07 LAB — SARS CORONAVIRUS 2 BY RT PCR (HOSPITAL ORDER, PERFORMED IN ~~LOC~~ HOSPITAL LAB): SARS Coronavirus 2: NEGATIVE

## 2020-02-07 MED ORDER — ONDANSETRON HCL 4 MG/2ML IJ SOLN
4.0000 mg | Freq: Once | INTRAMUSCULAR | Status: AC
  Administered 2020-02-07: 4 mg via INTRAVENOUS
  Filled 2020-02-07: qty 2

## 2020-02-07 MED ORDER — METOPROLOL TARTRATE 25 MG PO TABS
100.0000 mg | ORAL_TABLET | Freq: Two times a day (BID) | ORAL | Status: DC
  Administered 2020-02-07: 100 mg via ORAL
  Filled 2020-02-07: qty 4

## 2020-02-07 MED ORDER — FUROSEMIDE 40 MG PO TABS
20.0000 mg | ORAL_TABLET | Freq: Every day | ORAL | Status: DC
  Administered 2020-02-07: 20 mg via ORAL
  Filled 2020-02-07: qty 1

## 2020-02-07 MED ORDER — IOHEXOL 350 MG/ML SOLN
100.0000 mL | Freq: Once | INTRAVENOUS | Status: AC | PRN
  Administered 2020-02-07: 80 mL via INTRAVENOUS

## 2020-02-07 MED ORDER — ACETAMINOPHEN 325 MG PO TABS: 650.0000 mg | ORAL_TABLET | Freq: Four times a day (QID) | ORAL | Status: DC | PRN

## 2020-02-07 MED ORDER — POTASSIUM CHLORIDE CRYS ER 20 MEQ PO TBCR
40.0000 meq | EXTENDED_RELEASE_TABLET | Freq: Once | ORAL | Status: AC
  Administered 2020-02-07: 40 meq via ORAL
  Filled 2020-02-07: qty 2

## 2020-02-07 MED ORDER — ENOXAPARIN SODIUM 40 MG/0.4ML ~~LOC~~ SOLN: 40.0000 mg | SUBCUTANEOUS | Status: DC

## 2020-02-07 MED ORDER — HYDRALAZINE HCL 25 MG PO TABS: 25.0000 mg | ORAL_TABLET | Freq: Four times a day (QID) | ORAL | Status: DC | PRN

## 2020-02-07 MED ORDER — ACETAMINOPHEN 650 MG RE SUPP: 650.0000 mg | Freq: Four times a day (QID) | RECTAL | Status: DC | PRN

## 2020-02-07 NOTE — Progress Notes (Signed)
Spoke with attending, Dr. Maryfrances Bunnell, will hold BiPAP at this time. RT available for further needs for this patient.

## 2020-02-07 NOTE — ED Provider Notes (Signed)
[facility]-EMERGENCY DEPT Provider Note   CSN: [id number] Arrival date & time: 02/07/20  6301     History Chief Complaint  Patient presents with  . Dizziness  . Nausea    Jenipher JUANELLE TRUEHEART is a 80 y.o. female.  HPI     This is a 80 year old female with a history of asthma and hypertension who presents with dizziness and nausea.  Patient reports that she has been up most of the night making wreaths.  This is not abnormal for her.  She states that she stood up to walk she had the bathroom when she felt dizzy.  She describes as lightheadedness and feeling off balance.  She denies room spinning dizziness.  She states "I felt like I might pass out."  She did not have any chest pain or shortness of breath.  She did not note any weakness, numbness, speech difficulty, vision changes.  She states that when sitting still she is asymptomatic.  She is without complaint right now.  No recent illnesses or fevers.  She reports cough.  No known sick exposures or Covid contacts.  She denies headache.  She states that she just continued to feel "weird" and wanted to be checked out. Past Medical History:  Diagnosis Date  . Asthma   . Hypertension     There are no problems to display for this patient.   Past Surgical History:  Procedure Laterality Date  . ABDOMINAL HYSTERECTOMY       OB History    Gravida  6   Para  1   Term  1   Preterm      AB      Living  6     SAB      IAB      Ectopic      Multiple      Live Births  5           Family History  Problem Relation Age of Onset  . Diabetes Mother     Social History   Tobacco Use  . Smoking status: Former Smoker    Types: Cigarettes  . Smokeless tobacco: Never Used  Vaping Use  . Vaping Use: Never used  Substance Use Topics  . Alcohol use: No  . Drug use: No    Home Medications Prior to Admission medications   Medication Sig Start Date End Date Taking? Authorizing Provider  albuterol  (VENTOLIN HFA) 108 (90 Base) MCG/ACT inhaler Inhale 1-2 puffs into the lungs every 6 (six) hours as needed for wheezing or shortness of breath.    [provider]  calcium-vitamin D (OSCAL WITH D) 500-200 MG-UNIT tablet Take 1 tablet by mouth.    [provider]  clobetasol ointment (TEMOVATE) 0.05 % Apply 1 application topically 2 (two) times daily. 09/02/19   Malachy Chamber, MD  diclofenac Sodium (VOLTAREN) 1 % GEL Apply 2 g topically 4 (four) times daily. 09/02/19   Malachy Chamber, MD  furosemide (LASIX) 20 MG tablet Take 20 mg by mouth daily. 12/13/15   [provider]  metoprolol tartrate (LOPRESSOR) 100 MG tablet Take 100 mg by mouth 2 (two) times daily. 07/03/16   [provider]    Allergies    Aloe vera, Latex, Naproxen, and Penicillins  Review of Systems   Review of Systems  Constitutional: Negative for fever.  Eyes: Negative for photophobia and visual disturbance.  Respiratory: Positive for cough. Negative for shortness of breath.  Cardiovascular: Negative for chest pain.  Gastrointestinal: Positive for nausea. Negative for abdominal pain and vomiting.  Genitourinary: Negative for dysuria.  Neurological: Positive for light-headedness. Negative for dizziness, seizures, syncope, weakness and headaches.  All other systems reviewed and are negative.   Physical Exam Updated Vital Signs BP (!) 169/98   Pulse 74   Temp 97.7 F (36.5 C) (Oral)   Resp 17   Ht 1.549 m (5\' 1" )   Wt 86.2 kg   SpO2 94%   BMI 35.90 kg/m   Physical Exam Vitals and nursing note reviewed.  Constitutional:      Appearance: She is well-developed and well-nourished. She is not ill-appearing.  HENT:     Head: Normocephalic and atraumatic.     Nose: Nose normal.     Mouth/Throat:     Mouth: Mucous membranes are dry.  Eyes:     Extraocular Movements: Extraocular movements intact.     Pupils: Pupils are equal, round, and reactive to light.     Comments: No  nystagmus noted  Cardiovascular:     Rate and Rhythm: Normal rate and regular rhythm.     Heart sounds: Normal heart sounds.  Pulmonary:     Effort: Pulmonary effort is normal. No respiratory distress.     Breath sounds: No wheezing.  Abdominal:     General: Bowel sounds are normal.     Palpations: Abdomen is soft.     Tenderness: There is no abdominal tenderness.  Musculoskeletal:     Cervical back: Neck supple.     Comments: 1+ bilateral lower extremity edema  Skin:    General: Skin is warm and dry.  Neurological:     Mental Status: She is alert and oriented to person, place, and time.     Comments: Cranial nerves II through XII intact, 5 out of 5 strength in all 4 extremities, no dysmetria to finger-nose-finger, no drift, gait testing deferred  Psychiatric:        Mood and Affect: Mood and affect and mood normal.     ED Results / Procedures / Treatments   Labs (all labs ordered are listed, but only abnormal results are displayed) Labs Reviewed  BASIC METABOLIC PANEL - Abnormal; Notable for the following components:      Result Value   Potassium 3.3 (*)    Glucose, Bld 122 (*)    Calcium 8.8 (*)    All other components within normal limits  URINALYSIS, ROUTINE W REFLEX MICROSCOPIC - Abnormal; Notable for the following components:   Color, Urine COLORLESS (*)    All other components within normal limits  SARS CORONAVIRUS 2 BY RT PCR (HOSPITAL ORDER, PERFORMED IN [facility])  CBC WITH DIFFERENTIAL/PLATELET  BRAIN NATRIURETIC PEPTIDE  D-DIMER, QUANTITATIVE (NOT AT [facility])    EKG EKG Interpretation  Date/Time:  Sunday February 07 2020 06:22:23 EST Ventricular Rate:  64 PR Interval:    QRS Duration: 91 QT Interval:  378 QTC Calculation: 390 R Axis:   31 Text Interpretation: Sinus rhythm Probable anteroseptal infarct, old Confirmed by [id number] (Ross Marcus) on 02/07/2020 6:43:20 AM   Radiology CT Head Wo Contrast  Result Date: 02/07/2020 CLINICAL  DATA:  Nonspecific dizziness with nausea beginning this morning EXAM: CT HEAD WITHOUT CONTRAST TECHNIQUE: Contiguous axial images were obtained from the base of the skull through the vertex without intravenous contrast. COMPARISON:  Or 07/12/2010 FINDINGS: Brain: No evidence of acute infarction, hemorrhage, hydrocephalus, extra-axial collection or mass lesion/mass effect. Mild chronic small  vessel disease in the deep cerebral white matter. Age normal brain volume. Scattered dural calcification/ossification which is incidental. Vascular: No hyperdense vessel or unexpected calcification. Skull: Normal. Negative for fracture or focal lesion. Sinuses/Orbits: No acute finding. Clear mastoid and middle ear spaces. IMPRESSION: No acute finding or explanation for symptoms. Electronically Signed   By: Marnee Spring M.D.   On: 02/07/2020 06:54    Procedures Procedures   Medications Ordered in ED Medications  ondansetron (ZOFRAN) injection 4 mg (4 mg Intravenous Given 02/07/20 3762)    ED Course  I have reviewed the triage vital signs and the nursing notes.  Pertinent labs & imaging results that were available during my care of the patient were reviewed by me and considered in my medical decision making (see chart for details).  Clinical Course as of 02/07/20 8315  Wynelle Link Feb 07, 2020  0721 Checked on patient.  She states she still feels funny and lightheaded.  O2 sats noted to be 89% on room air with a good waveform.  No history of smoking or COPD.  Breath sounds are clear.  No sick contacts or Covid exposures.  She has received 2 vaccinations.  We will send Covid testing, obtain chest x-ray, and send D-dimer.  Question hypoxia as cause? [CH]    Clinical Course User Index [CH] Markeda Narvaez, Mayer Masker, MD   MDM Rules/Calculators/A&P                          Patient presents with lightheadedness near syncope.  She is overall nontoxic and initial vital signs are notable for hypertension with a blood pressure  of 186/104.  She is afebrile and otherwise her vital signs are reassuring.  She denies vertiginous symptoms and her neurologic exam is normal.  Considerations include but not limited to, arrhythmia, dehydration, systemic illness.  Lower suspicion for stroke.  Given her blood pressure however, will obtain CT head to rule out hypertensive emergency.  Orthostatics are negative.  EKG shows no evidence of arrhythmia.  See clinical course above.  On recheck, noted to be hypoxic with a good waveform and clear breath sounds.  Additional work-up added.  Question whether hypoxia could be causing some of the patient's symptoms.  Patient signed out to oncoming provider.   Final Clinical Impression(s) / ED Diagnoses Final diagnoses:  None    Rx / DC Orders ED Discharge Orders    None       Demetri Kerman, Mayer Masker, MD 02/07/20 [id number]

## 2020-02-07 NOTE — Progress Notes (Signed)
ABG sent to lab at this time; ABG drawn on 2 L [place].

## 2020-02-07 NOTE — ED Triage Notes (Addendum)
Pt came in via POV with c/o dizziness and nausea. Pt states that she was up around 0100 and standing in the kitchen when she had a dizzy spell and nausea. Pt states she felt like she was going to pass out. Since then she has a couple of bowel movements. Pt states her head feels "weird" and her vision is blurred. Pt's feet are swollen bilat. No hx of CHF or CKD. Pt pressure is 186/104.

## 2020-02-07 NOTE — Progress Notes (Signed)
Patient removed from BiPAP at 1635 to RA; ABG to be drawn on RA per MD.

## 2020-02-07 NOTE — Progress Notes (Signed)
  Echocardiogram 2D Echocardiogram has been performed.  Leta Jungling M 02/07/2020, 2:06 PM

## 2020-02-07 NOTE — Progress Notes (Signed)
   02/07/20 1638  TOC ED Mini Assessment  Barrier interventions [facility] ED RNCM was contacted concerning arranging Trilogy Home Ventilator from the ED today.  Patient was admitted but will be discharged home from the ED.  CM noted patient's ABG to assess  eligibility, CM contacted [facility] who will initiate the process, CM made Dr. Maryfrances Bunnell aware that we will have to wait until tomorrow for insurance approval but [facility] team will follow up in the am.

## 2020-02-07 NOTE — Consult Note (Signed)
Consult Note  Patient Name: Sherry Wood     NUU:[id number]    DOB: [birth year]    DOA: 02/07/2020 PCP: Macy Mis, MD  Patient coming from: Home  Chief Complaint: Pre-syncope      HPI: Sherry Wood is a 80 y.o. F with hx morbid obesity, HTN, pre-diabetes who presents with a pre-syncopal episode.  Patient was in her usual state of health (fully independent, lives alone, still drives, no known cognitive impairment) without recent symptoms until last night, she was up in her kitchen until 2AM making "wreaths" when she stood up to go to bed, felt lightheaded and weak, nearly passed out.  Her son came over and convinced her to go to the ER.  In the ER, heart rate and respirations normal, blood pressure 186/104, pulse ox 97% on room air initially.  Potassium 3.3, but glucose, renal function and other electrolytes normal.  WBC normal, no anemia.  Covid negative.  CT head unremarkable.  D-dimer was elevated, but CTA chest showed no PE, dissection, pneumonia, edema, atelectasis, effusion, or emphysema.  While being observed in the ER for several hours, the patient was noted to be hypoxic intermittently down to the mid 80s.  At other times her SPO2 was 94 to 99% on room air.  An ABG was drawn that showed SpO2 62 and bicarb 32.   Patient has no prior history of lung disease.  She has had no recent respiratory symptoms, no cough, wheezing, dyspnea on exertion, orthopnea, no sputum, PND.    She has never been diagnosed with sleep apnea or had PSG.  She has gained 100 lbs in the last 2 years, and has new leg swelling but no daytime sleepiness.           ROS: Review of Systems  Constitutional: Negative for chills, fever and malaise/fatigue.  Eyes: Negative for double vision.  Respiratory: Negative for cough, hemoptysis, sputum production, shortness of breath and wheezing.   Cardiovascular: Positive for leg swelling. Negative for chest pain, palpitations, orthopnea, claudication and PND.   Gastrointestinal: Negative for abdominal pain, blood in stool, constipation, diarrhea, melena and vomiting.  Genitourinary: Negative for dysuria, flank pain, frequency, hematuria and urgency.  Neurological: Negative for tingling, tremors, sensory change, speech change, focal weakness, seizures, loss of consciousness, weakness and headaches.  All other systems reviewed and are negative.         Past Medical History:  Diagnosis Date  . Asthma   . Gout   . Hypertension   . Parotid swelling    Intermittent since 2017, referred to ENT, thought maybe sialiadenitis  . Pre-diabetes     Past Surgical History:  Procedure Laterality Date  . ABDOMINAL HYSTERECTOMY      Social History: Patient lives alone.  The patient walks unassisted.  Remote former smoker, mild use history.  No alcohol use.  Still drives.  Allergies  Allergen Reactions  . Aloe Vera Rash  . Latex Rash  . Naproxen Nausea And Vomiting  . Penicillins Nausea Only    Has patient had a PCN reaction causing immediate rash, facial/tongue/throat swelling, SOB or lightheadedness with hypotension: Unknown Has patient had a PCN reaction causing severe rash involving mucus membranes or skin necrosis: unknown Has patient had a PCN reaction that required hospitalization: unknown Has patient had a PCN reaction occurring within the last 10 years: No If all of the above answers are "NO", then may proceed with Cephalosporin use    Family history: family history includes  Asthma in her sister; Cancer in her mother and sister; Diabetes in her mother and sister; Hypertension in her sister.  Prior to Admission medications   Medication Sig Start Date End Date Taking? Authorizing Provider  albuterol (VENTOLIN HFA) 108 (90 Base) MCG/ACT inhaler Inhale 1-2 puffs into the lungs every 6 (six) hours as needed for wheezing or shortness of breath.   Yes [provider]  calcium-vitamin D (OSCAL WITH D) 500-200 MG-UNIT tablet Take 1 tablet  by mouth daily with breakfast.   Yes [provider]  furosemide (LASIX) 20 MG tablet Take 20 mg by mouth daily. 12/13/15  Yes [provider]  metoprolol tartrate (LOPRESSOR) 100 MG tablet Take 100 mg by mouth 2 (two) times daily. 07/03/16  Yes [provider]  clobetasol ointment (TEMOVATE) 0.05 % Apply 1 application topically 2 (two) times daily. Patient not taking: Reported on 02/07/2020 09/02/19   Malachy Chamber, MD  diclofenac Sodium (VOLTAREN) 1 % GEL Apply 2 g topically 4 (four) times daily. Patient not taking: Reported on 02/07/2020 09/02/19   Malachy Chamber, MD       Physical Exam: BP (!) 172/104 Comment: RN notified.  Pulse 61   Temp 97.7 F (36.5 C) (Oral)   Resp 14   Ht 5\' 1"  (1.549 m)   Wt 86.2 kg   SpO2 93%   BMI 35.90 kg/m  General appearance: Well-developed, adult female, alert and in no acute distress.   Eyes: Anicteric, conjunctiva pink, lids and lashes normal. PERRL.    ENT: No nasal deformity, discharge, epistaxis.  Hearing normal. OP moist without lesions.  Lips normal, dentures in place.  Mallampati 4.  Mild asymmetry of left parotid/submandibular, but no palpable mass or firmness to me. Neck: No neck masses.  Trachea midline.  No thyromegaly/tenderness. Lymph: No cervical or supraclavicular lymphadenopathy. Skin: Warm and dry.  No jaundice.  No suspicious rashes or lesions. Cardiac: RRR, nl S1-S2, no murmurs appreciated.  Capillary refill is brisk.  JVP normal.  1+ LE edema to mid shin.  Radial and DP pulses 2+ and symmetric. Respiratory: Normal respiratory rate and rhythm.  CTAB without rales or wheezes. Abdomen: Abdomen soft.  No TTP or guarding. No ascites, distension, hepatosplenomegaly.   MSK: No deformities or effusions of the large joints of the upper or lower extremities bilaterally.  No cyanosis or clubbing. Neuro: Cranial nerves 3-12 intact.  Sensation intact to light touch. Speech is fluent.  Muscle strength 5/5 and  symmetric in all extremities.    Psych: Sensorium intact and responding to questions, attention normal.  Behavior appropriate.  Affect normal.  Judgment and insight appear normal.     Labs on Admission:  I have personally reviewed following labs and imaging studies: CBC: Recent Labs  Lab 02/07/20 0630  WBC 7.9  NEUTROABS 6.0  HGB 13.9  HCT 44.2  MCV 98.2  PLT 189   Basic Metabolic Panel: Recent Labs  Lab 02/07/20 0630  NA 138  K 3.3*  CL 99  CO2 30  GLUCOSE 122*  BUN 13  CREATININE 0.86  CALCIUM 8.8*   GFR: Estimated Creatinine Clearance: 53.8 mL/min (by C-G formula based on SCr of 0.86 mg/dL).  BNP (last 3 results) BNP 66  Recent Results (from the past 240 hour(s))  SARS Coronavirus 2 by RT PCR (hospital order, performed in [facility] hospital lab) Nasopharyngeal Nasopharyngeal Swab     Status: None   Collection Time: 02/07/20  8:17 AM   Specimen: Nasopharyngeal Swab  Result Value Ref Range Status   SARS Coronavirus 2 NEGATIVE NEGATIVE Final    Comment: (NOTE) SARS-CoV-2 target nucleic acids are NOT DETECTED.  The SARS-CoV-2 RNA is generally detectable in upper and lower respiratory specimens during the acute phase of infection. The lowest concentration of SARS-CoV-2 viral copies this assay can detect is 250 copies / mL. A negative result does not preclude SARS-CoV-2 infection and should not be used as the sole basis for treatment or other patient management decisions.  A negative result may occur with improper specimen collection / handling, submission of specimen other than nasopharyngeal swab, presence of viral mutation(s) within the areas targeted by this assay, and inadequate number of viral copies (<250 copies / mL). A negative result must be combined with clinical observations, patient history, and epidemiological information.  Fact Sheet for Patients:   BoilerBrush.com.cy  Fact Sheet for Healthcare  Providers: https://pope.com/  This test is not yet approved or  cleared by the [place] FDA and has been authorized for detection and/or diagnosis of SARS-CoV-2 by FDA under an Emergency Use Authorization (EUA).  This EUA will remain in effect (meaning this test can be used) for the duration of the COVID-19 declaration under Section 564(b)(1) of the Act, 21 U.S.C. section 360bbb-3(b)(1), unless the authorization is terminated or revoked sooner.  Performed at [facility], [address], [place], [place] [address]            Radiological Exams on Admission: Personally reviewed CXR shows no airspace disease.  There does appear to be a widened mediastinum on CXR< maybe rotation; CT chest report reviewed, shows no cardiomegaly, normal aorta, no PE, no pneumonia, effusion, pneumothorax, edema or emphysema; CT head report reviewed, mild chronic small vessel disease, no other acute findings: CT Head Wo Contrast  Result Date: 02/07/2020 CLINICAL DATA:  Nonspecific dizziness with nausea beginning this morning EXAM: CT HEAD WITHOUT CONTRAST TECHNIQUE: Contiguous axial images were obtained from the base of the skull through the vertex without intravenous contrast. COMPARISON:  Or 07/12/2010 FINDINGS: Brain: No evidence of acute infarction, hemorrhage, hydrocephalus, extra-axial collection or mass lesion/mass effect. Mild chronic small vessel disease in the deep cerebral white matter. Age normal brain volume. Scattered dural calcification/ossification which is incidental. Vascular: No hyperdense vessel or unexpected calcification. Skull: Normal. Negative for fracture or focal lesion. Sinuses/Orbits: No acute finding. Clear mastoid and middle ear spaces. IMPRESSION: No acute finding or explanation for symptoms. Electronically Signed   By: Marnee Spring M.D.   On: 02/07/2020 06:54   CT Angio Chest PE W and/or Wo Contrast  Result Date:  02/07/2020 CLINICAL DATA:  Low to intermediate probability for pulmonary embolism. EXAM: CT ANGIOGRAPHY CHEST WITH CONTRAST TECHNIQUE: Multidetector CT imaging of the chest was performed using the standard protocol during bolus administration of intravenous contrast. Multiplanar CT image reconstructions and MIPs were obtained to evaluate the vascular anatomy. CONTRAST:  28mL OMNIPAQUE IOHEXOL 350 MG/ML SOLN COMPARISON:  None. FINDINGS: Cardiovascular: Satisfactory opacification of the pulmonary arteries to the segmental level. No evidence of pulmonary embolism. Borderline heart size. No pericardial effusion. Mediastinum/Nodes: Negative for adenopathy or mass. Lungs/Pleura: There is no edema, consolidation, effusion, or pneumothorax. Upper Abdomen: No acute finding. Musculoskeletal: Generalized thoracic disc degeneration with disc space narrowing and endplate spurring. Review of the MIP images confirms the above findings. IMPRESSION: Negative for pulmonary embolism or other acute finding. Electronically Signed   By: Marnee Spring M.D.   On: 02/07/2020 10:24   DG Chest Portable 1  View  Result Date: 02/07/2020 CLINICAL DATA:  Cough and hypoxia EXAM: PORTABLE CHEST 1 VIEW COMPARISON:  01/04/2014 FINDINGS: Cardiomegaly and aortic tortuosity accentuated by rightward rotation. There is no edema, consolidation, effusion, or pneumothorax. Artifact from EKG leads. Postoperative distal left clavicle. No acute osseous finding. IMPRESSION: No acute finding. Electronically Signed   By: Marnee Spring M.D.   On: 02/07/2020 08:39    EKG: Independently reviewed. Rate 60s, ST segments normal, no ishcemic changes.  Sinus rhtyhm.  Normal QTc.       Assessment/Plan   Pre-syncope Metabolic work up normal except very mild hypokalemia.  CTA chest normal.  EKG normal.  Hemogram normal.   Pre-syncope is expected and normal for a 80 yo F who has stayed up until 2am several nights in a row making wreaths.  -No further  work up   Hypoxia Mild compensated respiratory acidosis, likely due to undiagnosed obstructive sleep apnea The patient has hypoxia without dyspnea.  Acute respiratory failure is ruled out. History and exam rule out COPD flare.  Chest imaging rules out PE, pneumonia (infectious or cryptogenic), pleural effusion, ILD, and CHF.  Patient has weight gain, new leg swelling, hypertension and nearly compensated respiratory acidosis, suggesting a diagnosis of OSA.     She is asymptomatic, suggesting this is mostly a chronic process.  However, her pH is 7.3 and her SpO2 drops to 82% with ambulation.  Therefore:  -Trial BiPAP this afternoon -Repeat ABG and ambulatory desat test after BiPAP trial  -If normal, will d/c home with Pulm follow up as an ouptaitent -If remains abnormal, will place on BiPAP QHS, consult Pulmonology in the morning and consult TOC for Trilogy home NIV     Hypertension BP uncontrolled due to sleep apnea. -Continue metoprolol and furosemide, follow up with PCP  Morbid obesity BMI 35 with sleep apnea, knee pain and hypertension  Hypokalemia Mild, mag normal -Supplement K      DVT prophylaxis: Lovenox  Code Status: FULL  Family Communication:   Disposition Plan: Anticipate trial of BiPAP.  If pH corrects and she has no desaturation with ambulation after trial, home this evening with pulm follow up.  Otherwise, will keep for pulm consultaiton in hospital. Consults called: Pulm, spoke with Dr. Craige Cotta; [facility] pulm have been notified and will evaluate patient in morning Admission status: OBS   At the point of initial evaluation, it is my clinical opinion that admission for OBSERVATION is reasonable and necessary because the patient's presenting complaints in the context of their chronic conditions represent sufficient risk of deterioration or significant morbidity to constitute reasonable grounds for close observation in the hospital setting, but that the patient may be  medically stable for discharge from the hospital within 24 to 48 hours.    Medical decision making: Patient seen at 12:17 PM on 02/07/2020.  The patient was discussed with Dr. Lynelle Doctor.  What exists of the patient's chart was reviewed in depth and summarized above.  Clinical condition: stable.        Earl Lites [facility] [facility] Please page though AMION or Epic secure chat:  For password, contact charge nurse

## 2020-02-07 NOTE — ED Provider Notes (Addendum)
Clinical Course as of 02/07/20 1059  Sun Feb 07, 2020  3220 Checked on patient.  She states she still feels funny and lightheaded.  O2 sats noted to be 89% on room air with a good waveform.  No history of smoking or COPD.  Breath sounds are clear.  No sick contacts or Covid exposures.  She has received 2 vaccinations.  We will send Covid testing, obtain chest x-ray, and send D-dimer.  Question hypoxia as cause? [CH]  0932 D dimer elevated.  Will ct to evaluate further [JK]  1045 CXR negative for pe or acute process [JK]  1058 Pt again off o2.  Sat in the mid 80s off oxygen.  Restarted [facility] oxygen.   [JK]    Clinical Course User Index [CH] Horton, Mayer Masker, MD [JK] Linwood Dibbles, MD   Patient initially seen by Dr. Wilkie Aye for issues with some dizziness and nausea.  Patient was noted to have an oxygen requirement here in the ED which is new.  She is Covid negative.  Chest x-ray does not show any acute findings.  D-dimer was elevated so CT angiogram was performed.  No acute PE noted.  Patient however does have persistent oxygen requirement and does not normally use oxygen at home.  She does have a history of obesity.  Wonder if she may have a component of obstructive sleep apnea with pulmonary hypertension.  With her new oxygen requirement will consult with the medical service for admission and further evaluation.   Linwood Dibbles, MD 02/07/20 1100  D/w Dr Maryfrances Bunnell.  He will see if it is possible to arrange for home o2.   Linwood Dibbles, MD 02/07/20 [phone]

## 2020-02-07 NOTE — Discharge Summary (Signed)
Physician Discharge Summary  Sherry Wood GNF:[id number] DOB: 1940/05/26 DOA: 02/07/2020  PCP: Macy Mis, MD  Admit date: 02/07/2020 Discharge date: 02/07/2020  Admitted From: Home  Disposition:  Home   Recommendations for Outpatient Follow-up:  1. Follow up with PCP  Dr. Earnest Bailey in 2 days 2. Follow up with Pulmonology in 1-2 weeks 3. Dr. Earnest Bailey: Please follow up Pulmonology referral, sleep study        Home Health: None  Equipment/Devices: Trilogy non-invasive ventilator  Discharge Condition: Good  CODE STATUS: FULL Diet recommendation: Low sodium  Brief/Interim Summary: Sherry Wood is a 80 y.o. F with hx morbid obesity, HTN, pre-diabetes who presents with a pre-syncopal episode.  Patient was up until 2AM doing crafts in the kitchen when she stood up, felt lightheaded and nearly passed out. She continued to feel sick and weak for a few hours after and so she came to the ER.  In the ER, she was incidentally noted to be intermittently asymptomatically hypoxic.         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  Acute respiratory failure was ruled out, findings not consistent with COPD flare.  Imaging and labs rule out PE, pneumonia (infectious or cryptogenic), ILD, pleural effusion, CHF, or other intrinsic lung diseases.  Given persistent hypoxia with ambulation, the patient was given a trial of BiPAP, after which, she was able to ambulate around the unit comfrotably without symptoms and with SpO2 89-94% on room air.  Given improvement with BiPAP, social work referred her for Trilogy non-invasive ventilator from [facility] home health, which should be delivered tomorrow.     -Recommend NIV use at night -Recommend Pulmonology evaluation     Pre-syncope Metabolic work up normal except very mild hypokalemia.  CTA chest normal.  EKG normal.  Hemogram normal.   Pre-syncope is expected and normal for a 80 yo F who has stayed up until 2am several nights in a row making wreaths. -No further work up      Hypertension BP uncontrolled due to sleep apnea. Continue metoprolol and furosemide, follow up with PCP  Morbid obesity BMI 35 with sleep apnea, knee pain and hypertension  Hypokalemia Supplemented.          Discharge Instructions  Discharge Instructions    Diet - low sodium heart healthy   Complete by: As directed    Discharge instructions   Complete by: As directed    From Dr. Maryfrances Bunnell:  You were evaluated for nearly passing out. Here, we found that your blood counts, kidney function, salts in your blood, blood sugar and heart tests were normal.  We ruled out stroke, blood clots and pneumonia as the cause of your symptoms.  We did notice that your oxygen level was low and your carbon dioxide was high, which is a pattern that can be seen with several medical conditions, but in your case appears to be from sleep apnea.  Likely you have had sleep apnea a long time, and we are seeing later stages.  Here, when we treated you with a machine to help you breathe while sleeping, your oxygen levels improved.  Go see Dr. Earnest Bailey this week Go see the Pulmonologist (lung and sleep specialist) Dr. Craige Cotta His information is below, his office  will contact you for an appointment, but call them if you haven't heard from them  Use the breathing machine every night when you go to sleep   Increase activity slowly   Complete by: As directed      Allergies as of 02/07/2020      Reactions   Aloe Vera Rash   Latex Rash   Naproxen Nausea And Vomiting   Penicillins Nausea Only   Has patient had a PCN reaction causing immediate rash,  facial/tongue/throat swelling, SOB or lightheadedness with hypotension: Unknown Has patient had a PCN reaction causing severe rash involving mucus membranes or skin necrosis: unknown Has patient had a PCN reaction that required hospitalization: unknown Has patient had a PCN reaction occurring within the last 10 years: No If all of the above answers are "NO", then may proceed with Cephalosporin use      Medication List    TAKE these medications   albuterol 108 (90 Base) MCG/ACT inhaler Commonly known as: VENTOLIN HFA Inhale 1-2 puffs into the lungs every 6 (six) hours as needed for wheezing or shortness of breath.   calcium-vitamin D 500-200 MG-UNIT tablet Commonly known as: OSCAL WITH D Take 1 tablet by mouth daily with breakfast.   furosemide 20 MG tablet Commonly known as: LASIX Take 20 mg by mouth daily.   metoprolol tartrate 100 MG tablet Commonly known as: LOPRESSOR Take 100 mg by mouth 2 (two) times daily.       Follow-up Information    Briscoe, Sharrie Rothman, MD. Schedule an appointment as soon as possible for a visit.   Specialty: Family Medicine Contact information: [address] [place] [address] [phone]        Coralyn Helling, MD. Schedule an appointment as soon as possible for a visit in 1 week(s).   Specialty: Pulmonary Disease Contact information: [address] [place] [address] [phone]              Allergies  Allergen Reactions  . Aloe Vera Rash  . Latex Rash  . Naproxen Nausea And Vomiting  . Penicillins Nausea Only    Has patient had a PCN reaction causing immediate rash, facial/tongue/throat swelling, SOB or lightheadedness with hypotension: Unknown Has patient had a PCN reaction causing severe rash involving mucus membranes or skin necrosis: unknown Has patient had a PCN reaction that required hospitalization: unknown Has patient had a PCN reaction occurring within the last 10 years: No If all of  the above answers are "NO", then may proceed with Cephalosporin use       Procedures/Studies: CT Head Wo Contrast  Result Date: 02/07/2020 CLINICAL DATA:  Nonspecific dizziness with nausea beginning this morning EXAM: CT HEAD WITHOUT CONTRAST TECHNIQUE: Contiguous axial images were obtained from the base of the skull through the vertex without intravenous contrast. COMPARISON:  Or 07/12/2010 FINDINGS: Brain: No evidence of acute infarction, hemorrhage, hydrocephalus, extra-axial collection or mass lesion/mass effect. Mild chronic small vessel disease in the deep cerebral white matter. Age normal brain volume. Scattered dural calcification/ossification which is incidental. Vascular: No hyperdense vessel or unexpected calcification. Skull: Normal. Negative for fracture or focal lesion. Sinuses/Orbits: No acute finding. Clear mastoid and middle ear spaces. IMPRESSION: No acute finding or explanation for symptoms. Electronically Signed   By: Marnee Spring M.D.   On: 02/07/2020 06:54   CT Angio Chest PE W and/or Wo Contrast  Result Date: 02/07/2020 CLINICAL DATA:  Low to intermediate probability for pulmonary embolism. EXAM:  CT ANGIOGRAPHY CHEST WITH CONTRAST TECHNIQUE: Multidetector CT imaging of the chest was performed using the standard protocol during bolus administration of intravenous contrast. Multiplanar CT image reconstructions and MIPs were obtained to evaluate the vascular anatomy. CONTRAST:  80mL OMNIPAQUE IOHEXOL 350 MG/ML SOLN COMPARISON:  None. FINDINGS: Cardiovascular: Satisfactory opacification of the pulmonary arteries to the segmental level. No evidence of pulmonary embolism. Borderline heart size. No pericardial effusion. Mediastinum/Nodes: Negative for adenopathy or mass. Lungs/Pleura: There is no edema, consolidation, effusion, or pneumothorax. Upper Abdomen: No acute finding. Musculoskeletal: Generalized thoracic disc degeneration with disc space narrowing and endplate spurring.  Review of the MIP images confirms the above findings. IMPRESSION: Negative for pulmonary embolism or other acute finding. Electronically Signed   By: Marnee SpringJonathon  Watts M.D.   On: 02/07/2020 10:24   DG Chest Portable 1 View  Result Date: 02/07/2020 CLINICAL DATA:  Cough and hypoxia EXAM: PORTABLE CHEST 1 VIEW COMPARISON:  01/04/2014 FINDINGS: Cardiomegaly and aortic tortuosity accentuated by rightward rotation. There is no edema, consolidation, effusion, or pneumothorax. Artifact from EKG leads. Postoperative distal left clavicle. No acute osseous finding. IMPRESSION: No acute finding. Electronically Signed   By: Marnee SpringJonathon  Watts M.D.   On: 02/07/2020 08:39   ECHOCARDIOGRAM COMPLETE  Result Date: 02/07/2020    ECHOCARDIOGRAM REPORT   Patient Name:   Carlyle DollyHATTIE M Hearns Date of Exam: 02/07/2020 Medical Rec #:  [id number]      Height:       61.0 in Accession #:    [id number]     Weight:       190.0 lb Date of Birth:  01/19/1940       BSA:          1.848 m Patient Age:    79 years       BP:           169/89 mmHg Patient Gender: F              HR:           81 bpm. Exam Location:  Inpatient Procedure: 2D Echo, Color Doppler, Cardiac Doppler and Strain Analysis Indications:    Syncope 780.2 / R55  History:        Patient has no prior history of Echocardiogram examinations.                 Risk Factors:Hypertension and Pre-diabetes. Weight gain, new leg                 swelling, hypertension and nearly compensated respiratory                 acidosis, suggesting a diagnosis of OSA.  Sonographer:    Leta Junglingiffany Cooper RDCS Referring Phys: [id number] CHRISTOPHER P DANFORD IMPRESSIONS  1. Left ventricular ejection fraction, by estimation, is 60 to 65%. The left ventricle has normal function. The left ventricle has no regional wall motion abnormalities. Left ventricular diastolic parameters are consistent with Grade I diastolic dysfunction (impaired relaxation). The average left ventricular global longitudinal strain is -20.0 %. The  global longitudinal strain is normal.  2. Right ventricular systolic function is normal. The right ventricular size is normal. Tricuspid regurgitation signal is inadequate for assessing PA pressure.  3. Left atrial size was mildly dilated.  4. The mitral valve is normal in structure. No evidence of mitral valve regurgitation. No evidence of mitral stenosis.  5. The aortic valve is tricuspid. Aortic valve regurgitation is trivial. Mild aortic valve sclerosis is present, with no  evidence of aortic valve stenosis.  6. The inferior vena cava is normal in size with greater than 50% respiratory variability, suggesting right atrial pressure of 3 mmHg. FINDINGS  Left Ventricle: Left ventricular ejection fraction, by estimation, is 60 to 65%. The left ventricle has normal function. The left ventricle has no regional wall motion abnormalities. The average left ventricular global longitudinal strain is -20.0 %. The global longitudinal strain is normal. The left ventricular internal cavity size was normal in size. There is no left ventricular hypertrophy. Left ventricular diastolic parameters are consistent with Grade I diastolic dysfunction (impaired relaxation). Normal left ventricular filling pressure. Right Ventricle: The right ventricular size is normal. No increase in right ventricular wall thickness. Right ventricular systolic function is normal. Tricuspid regurgitation signal is inadequate for assessing PA pressure. Left Atrium: Left atrial size was mildly dilated. Right Atrium: Right atrial size was normal in size. Pericardium: There is no evidence of pericardial effusion. Mitral Valve: The mitral valve is normal in structure. No evidence of mitral valve regurgitation. No evidence of mitral valve stenosis. Tricuspid Valve: The tricuspid valve is normal in structure. Tricuspid valve regurgitation is not demonstrated. No evidence of tricuspid stenosis. Aortic Valve: The aortic valve is tricuspid. Aortic valve  regurgitation is trivial. Mild aortic valve sclerosis is present, with no evidence of aortic valve stenosis. Pulmonic Valve: The pulmonic valve was normal in structure. Pulmonic valve regurgitation is not visualized. No evidence of pulmonic stenosis. Aorta: The aortic root is normal in size and structure. Venous: The inferior vena cava is normal in size with greater than 50% respiratory variability, suggesting right atrial pressure of 3 mmHg. IAS/Shunts: No atrial level shunt detected by color flow Doppler.  LEFT VENTRICLE PLAX 2D LVIDd:         4.50 cm  Diastology LVIDs:         2.80 cm  LV e' medial:    3.98 cm/s LV PW:         1.10 cm  LV E/e' medial:  10.8 LV IVS:        1.00 cm  LV e' lateral:   4.56 cm/s LVOT diam:     2.00 cm  LV E/e' lateral: 9.4 LV SV:         57 LV SV Index:   31       2D Longitudinal Strain LVOT Area:     3.14 cm 2D Strain GLS Avg:     -20.0 %  RIGHT VENTRICLE RV S prime:     20.20 cm/s TAPSE (M-mode): 2.6 cm LEFT ATRIUM             Index       RIGHT ATRIUM           Index LA diam:        4.10 cm 2.22 cm/m  RA Area:     14.20 cm LA Vol (A2C):   29.1 ml 15.75 ml/m RA Volume:   30.40 ml  16.45 ml/m LA Vol (A4C):   26.6 ml 14.39 ml/m LA Biplane Vol: 28.8 ml 15.58 ml/m  AORTIC VALVE LVOT Vmax:   91.60 cm/s LVOT Vmean:  59.700 cm/s LVOT VTI:    0.183 m  AORTA Ao Root diam: 2.80 cm MITRAL VALVE MV Area (PHT): 2.07 cm    SHUNTS MV Decel Time: 366 msec    Systemic VTI:  0.18 m MV E velocity: 42.80 cm/s  Systemic Diam: 2.00 cm MV A velocity: 55.90 cm/s MV E/A ratio:  0.77 Thurmon Fair MD Electronically signed by Thurmon Fair MD Signature Date/Time: 02/07/2020/2:14:23 PM    Final        Subjective: Feels well.  No dizziness, no nausea, no headache.  No dyspnea on exertion.  Discharge Exam: Vitals:   02/07/20 1756 02/07/20 1830  BP:  (!) 128/98  Pulse:  62  Resp: 18 18  Temp:    SpO2: (!) 89% 93%   Vitals:   02/07/20 1600 02/07/20 1630 02/07/20 1756 02/07/20 1830  BP:  139/80 (!) 122/108  (!) 128/98  Pulse: (!) 54 (!) 54  62  Resp: 15 14 18 18   Temp:      TempSrc:      SpO2: 98% 96% (!) 89% 93%  Weight:      Height:        General: Pt is alert, awake, not in acute distress Cardiovascular: RRR, nl S1-S2, no murmurs appreciated.   1+ edema.   Respiratory: Normal respiratory rate and rhythm.  CTAB without rales or wheezes. Abdominal: Abdomen soft and non-tender.  No distension or HSM.   Neuro/Psych: Strength symmetric in upper and lower extremities.  Judgment and insight appear normal.   The results of significant diagnostics from this hospitalization (including imaging, microbiology, ancillary and laboratory) are listed below for reference.     Microbiology: Recent Results (from the past 240 hour(s))  SARS Coronavirus 2 by RT PCR (hospital order, performed in [facility] hospital lab) Nasopharyngeal Nasopharyngeal Swab     Status: None   Collection Time: 02/07/20  8:17 AM   Specimen: Nasopharyngeal Swab  Result Value Ref Range Status   SARS Coronavirus 2 NEGATIVE NEGATIVE Final    Comment: (NOTE) SARS-CoV-2 target nucleic acids are NOT DETECTED.  The SARS-CoV-2 RNA is generally detectable in upper and lower respiratory specimens during the acute phase of infection. The lowest concentration of SARS-CoV-2 viral copies this assay can detect is 250 copies / mL. A negative result does not preclude SARS-CoV-2 infection and should not be used as the sole basis for treatment or other patient management decisions.  A negative result may occur with improper specimen collection / handling, submission of specimen other than nasopharyngeal swab, presence of viral mutation(s) within the areas targeted by this assay, and inadequate number of viral copies (<250 copies / mL). A negative result must be combined with clinical observations, patient history, and epidemiological information.  Fact Sheet for Patients:    02/09/20  Fact Sheet for Healthcare Providers: BoilerBrush.com.cy  This test is not yet approved or  cleared by the https://pope.com/ FDA and has been authorized for detection and/or diagnosis of SARS-CoV-2 by FDA under an Emergency Use Authorization (EUA).  This EUA will remain in effect (meaning this test can be used) for the duration of the COVID-19 declaration under Section 564(b)(1) of the Act, 21 U.S.C. section 360bbb-3(b)(1), unless the authorization is terminated or revoked sooner.  Performed at [facility], [address], [place], [place]      Labs: BNP (last 3 results) Recent Labs    02/07/20 0630  BNP 66.5   Basic Metabolic Panel: Recent Labs  Lab 02/07/20 0630  NA 138  K 3.3*  CL 99  CO2 30  GLUCOSE 122*  BUN 13  CREATININE 0.86  CALCIUM 8.8*  MG 2.2   Liver Function Tests: No results for input(s): AST, ALT, ALKPHOS, BILITOT, PROT, ALBUMIN in the last 168 hours. No results for input(s): LIPASE, AMYLASE in the last 168  hours. No results for input(s): AMMONIA in the last 168 hours. CBC: Recent Labs  Lab 02/07/20 0630  WBC 7.9  NEUTROABS 6.0  HGB 13.9  HCT 44.2  MCV 98.2  PLT 189   Cardiac Enzymes: No results for input(s): CKTOTAL, CKMB, CKMBINDEX, TROPONINI in the last 168 hours. BNP: Invalid input(s): POCBNP CBG: No results for input(s): GLUCAP in the last 168 hours. D-Dimer Recent Labs    02/07/20 0720  DDIMER 2.40*   Hgb A1c No results for input(s): HGBA1C in the last 72 hours. Lipid Profile No results for input(s): CHOL, HDL, LDLCALC, TRIG, CHOLHDL, LDLDIRECT in the last 72 hours. Thyroid function studies No results for input(s): TSH, T4TOTAL, T3FREE, THYROIDAB in the last 72 hours.  Invalid input(s): FREET3 Anemia work up No results for input(s): VITAMINB12, FOLATE, FERRITIN, TIBC, IRON, RETICCTPCT in the last 72 hours. Urinalysis     Component Value Date/Time   COLORURINE COLORLESS (A) 02/07/2020 0630   APPEARANCEUR CLEAR 02/07/2020 0630   LABSPEC 1.006 02/07/2020 0630   PHURINE 8.0 02/07/2020 0630   GLUCOSEU NEGATIVE 02/07/2020 0630   HGBUR NEGATIVE 02/07/2020 0630   BILIRUBINUR NEGATIVE 02/07/2020 0630   KETONESUR NEGATIVE 02/07/2020 0630   PROTEINUR NEGATIVE 02/07/2020 0630   UROBILINOGEN 0.2 07/12/2010 2100   NITRITE NEGATIVE 02/07/2020 0630   LEUKOCYTESUR NEGATIVE 02/07/2020 0630   Sepsis Labs Invalid input(s): PROCALCITONIN,  WBC,  LACTICIDVEN Microbiology Recent Results (from the past 240 hour(s))  SARS Coronavirus 2 by RT PCR (hospital order, performed in [facility] hospital lab) Nasopharyngeal Nasopharyngeal Swab     Status: None   Collection Time: 02/07/20  8:17 AM   Specimen: Nasopharyngeal Swab  Result Value Ref Range Status   SARS Coronavirus 2 NEGATIVE NEGATIVE Final    Comment: (NOTE) SARS-CoV-2 target nucleic acids are NOT DETECTED.  The SARS-CoV-2 RNA is generally detectable in upper and lower respiratory specimens during the acute phase of infection. The lowest concentration of SARS-CoV-2 viral copies this assay can detect is 250 copies / mL. A negative result does not preclude SARS-CoV-2 infection and should not be used as the sole basis for treatment or other patient management decisions.  A negative result may occur with improper specimen collection / handling, submission of specimen other than nasopharyngeal swab, presence of viral mutation(s) within the areas targeted by this assay, and inadequate number of viral copies (<250 copies / mL). A negative result must be combined with clinical observations, patient history, and epidemiological information.  Fact Sheet for Patients:   BoilerBrush.com.cy  Fact Sheet for Healthcare Providers: https://pope.com/  This test is not yet approved or  cleared by the [place] FDA and has  been authorized for detection and/or diagnosis of SARS-CoV-2 by FDA under an Emergency Use Authorization (EUA).  This EUA will remain in effect (meaning this test can be used) for the duration of the COVID-19 declaration under Section 564(b)(1) of the Act, 21 U.S.C. section 360bbb-3(b)(1), unless the authorization is terminated or revoked sooner.  Performed at [facility], [address] [place], [place] [address]      Time coordinating discharge: 25 minutes The [place] controlled substances registry was reviewed for this patient      SIGNED:   Alberteen Sam, MD  [facility] 02/07/2020, 6:47 PM

## 2020-03-23 ENCOUNTER — Other Ambulatory Visit: Payer: Self-pay | Admitting: Family Medicine

## 2020-03-23 DIAGNOSIS — M810 Age-related osteoporosis without current pathological fracture: Secondary | ICD-10-CM

## 2020-03-25 ENCOUNTER — Other Ambulatory Visit: Payer: Self-pay | Admitting: Family Medicine

## 2020-03-25 ENCOUNTER — Ambulatory Visit
Admission: RE | Admit: 2020-03-25 | Discharge: 2020-03-25 | Disposition: A | Discharge status: Home / Self Care | Payer: Medicare HMO | Source: Ambulatory Visit | Attending: Family Medicine | Admitting: Family Medicine

## 2020-03-25 DIAGNOSIS — M545 Low back pain, unspecified: Secondary | ICD-10-CM

## 2020-04-13 ENCOUNTER — Encounter: Payer: Self-pay | Admitting: Pulmonary Disease

## 2020-04-13 ENCOUNTER — Ambulatory Visit (INDEPENDENT_AMBULATORY_CARE_PROVIDER_SITE_OTHER): Payer: Medicare HMO | Admitting: Pulmonary Disease

## 2020-04-13 ENCOUNTER — Other Ambulatory Visit: Payer: Self-pay

## 2020-04-13 DIAGNOSIS — J9612 Chronic respiratory failure with hypercapnia: Secondary | ICD-10-CM | POA: Diagnosis not present

## 2020-04-13 DIAGNOSIS — E662 Morbid (severe) obesity with alveolar hypoventilation: Secondary | ICD-10-CM | POA: Diagnosis not present

## 2020-04-13 DIAGNOSIS — J9611 Chronic respiratory failure with hypoxia: Secondary | ICD-10-CM | POA: Diagnosis not present

## 2020-04-13 NOTE — Assessment & Plan Note (Signed)
Unclear etiology of hypoventilation-she has never been a smoker smoked less than 10 pack years before quitting.  She does not have any evidence of neuromuscular disease She does have BMI of 35 and meets criteria for obesity but does not appear to be morbidly obese We will obtain PFTs with MIP and MEP to clarify

## 2020-04-13 NOTE — Patient Instructions (Addendum)
Your carbon dioxide level is high and oxygen level was low for unclear reason Ambulatory saturation. Schedule home sleep test. Schedule PFTs  Meanwhile try to use your machine every night when you sleep, this will help your breathing

## 2020-04-13 NOTE — Assessment & Plan Note (Signed)
She does not seem to have significant sleepiness but does have daytime fatigue We will proceed with home sleep testing and if positive will proceed with attended titration study. If negative we will still schedule NPSG to clarify

## 2020-04-13 NOTE — Progress Notes (Signed)
Subjective:    Patient ID: Sherry Wood, female    DOB: November 14, 1940, 80 y.o.   MRN: [id number]  HPI   Chief Complaint  Patient presents with  . Follow-up  . Consult    Snoring, 6 -7 hours of sleep, goes to sleep at 1-2 am and wakes up at 11-12 pm, feelings rested in morning.     80 year old remote smoker who presents for evaluation of chronic hypercarbic respiratory failure  She presented to [facility] long ED on 02/07/2020 for dizziness and was found to be hypoxic to mid 80s on room air  ABG was 7.3 3/62/10 1/97% on 28% FiO2. 7.3 6/56/50 4/85% on room air She underwent evaluation for elevated D-dimer including CT angiogram chest which was negative, head CT was negative.  She was discharged on trilogy  NIV which was delivered to her home by [facility] with full facemask.  She used this for a couple of nights and then developed hiccups and stopped using it. 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 Sleepiness score is 0. Bedtime is between 1 and 2 AM, sleep latency is minimal, she sleeps on her side with 1-2 pillows, denies nocturnal awakenings and is out of bed by 11 AM feeling rested without dryness of mouth or headaches. She admits to 40 pound weight gain within the last 3 years There is no history suggestive of cataplexy, sleep paralysis or parasomnias She smoked less than 10 pack years before she quit in 1972. She still stays active and makes wreaths     Significant tests/ events reviewed  CTA chest 02/07/20 neg PE Echo 01/2020 normal RV SF  Past Medical History:  Diagnosis Date  . Asthma   . Gout   . Hypertension   . Parotid swelling     Intermittent since 2017, referred to ENT, thought maybe sialiadenitis  . Pre-diabetes     Past Surgical History:  Procedure Laterality Date  . ABDOMINAL HYSTERECTOMY      Allergies  Allergen Reactions  . Aloe Vera Rash  . Latex Rash  . Naproxen Nausea And Vomiting  . Penicillins Nausea Only    Has patient had a PCN reaction causing immediate rash, facial/tongue/throat swelling, SOB or lightheadedness with hypotension: Unknown Has patient had a PCN reaction causing severe rash involving mucus membranes or skin necrosis: unknown Has patient had a PCN reaction that required hospitalization: unknown Has patient had a PCN reaction occurring within the last 10 years: No If all of the above answers are "NO", then may proceed with Cephalosporin use    Social History   Socioeconomic History  . Marital status: Single    Spouse name: Not on file  . Number of children: Not on file  . Years of education: Not on file  . Highest education level: Not on file  Occupational History  . Not on file  Tobacco Use  . Smoking status: Former Smoker    Years: 12.00    Types: Cigarettes    Quit date: 1972    Years since quitting: 50.2  . Smokeless tobacco: Never Used  . Tobacco comment: 1-2 cigarettes a day  Vaping Use  . Vaping Use: Never  used  Substance and Sexual Activity  . Alcohol use: No  . Drug use: No  . Sexual activity: Not Currently  Other Topics Concern  . Not on file  Social History Narrative  . Not on file   Social Determinants of Health   Financial Resource Strain: Not on file  Food Insecurity: Not on file  Transportation Needs: Not on file  Physical Activity: Not on file  Stress: Not on file  Social Connections: Not on file  Intimate Partner Violence: Not on file    Family History  Problem Relation Age of Onset  . Diabetes Mother   . Cancer Mother   . Asthma Sister   . Cancer Sister        Bone  . Hypertension Sister   . Diabetes Sister      Review of  Systems Constitutional: negative for anorexia, fevers and sweats  Eyes: negative for irritation, redness and visual disturbance  Ears, nose, mouth, throat, and face: negative for earaches, epistaxis, nasal congestion and sore throat  Respiratory: negative for cough, dyspnea on exertion, sputum and wheezing  Cardiovascular: negative for chest pain, dyspnea, lower extremity edema, orthopnea, palpitations and syncope  Gastrointestinal: negative for abdominal pain, constipation, diarrhea, melena, nausea and vomiting  Genitourinary:negative for dysuria, frequency and hematuria  Hematologic/lymphatic: negative for bleeding, easy bruising and lymphadenopathy  Musculoskeletal:negative for arthralgias, muscle weakness and stiff joints  Neurological: negative for coordination problems, gait problems, headaches and weakness  Endocrine: negative for diabetic symptoms including polydipsia, polyuria and weight loss     Objective:   Physical Exam  Gen. Pleasant, obese, in no distress, normal affect ENT - no pallor,icterus, no post nasal drip, class 2-3 airway Neck: No JVD, no thyromegaly, no carotid bruits Lungs: no use of accessory muscles, no dullness to percussion, decreased without rales or rhonchi  Cardiovascular: Rhythm regular, heart sounds  normal, no murmurs or gallops, no peripheral edema Abdomen: soft and non-tender, no hepatosplenomegaly, BS normal. Musculoskeletal: No deformities, no cyanosis or clubbing Neuro:  alert, non focal, no tremors, power 5/5 all 4Es, nml reflexes       Assessment & Plan:

## 2020-05-06 ENCOUNTER — Telehealth: Payer: Self-pay | Admitting: Pulmonary Disease

## 2020-05-09 NOTE — Telephone Encounter (Signed)
I had pt scheduled for today.  I called and rescheduled her hst.  Nothing further needed.

## 2020-05-11 ENCOUNTER — Ambulatory Visit: Payer: Medicare HMO

## 2020-05-11 ENCOUNTER — Other Ambulatory Visit: Payer: Self-pay

## 2020-05-11 DIAGNOSIS — E662 Morbid (severe) obesity with alveolar hypoventilation: Secondary | ICD-10-CM

## 2020-05-11 DIAGNOSIS — G4733 Obstructive sleep apnea (adult) (pediatric): Secondary | ICD-10-CM | POA: Diagnosis not present

## 2020-05-12 ENCOUNTER — Telehealth: Payer: Self-pay | Admitting: Pulmonary Disease

## 2020-05-12 DIAGNOSIS — G4733 Obstructive sleep apnea (adult) (pediatric): Secondary | ICD-10-CM | POA: Diagnosis not present

## 2020-05-12 DIAGNOSIS — R0689 Other abnormalities of breathing: Secondary | ICD-10-CM

## 2020-05-12 NOTE — Telephone Encounter (Signed)
HST showed mild  OSA with AHI 10/ hr   She needs split night study in the lab to see why her CO2 is high & whether BiPAP will help with this. Please arrange

## 2020-05-13 NOTE — Telephone Encounter (Signed)
Called and went over HST results per Dr Vassie Loll with patient. All questions answered and patient expressed full understanding and agreeable to Dr Reginia Naas recommendations for split night study. Order placed per Dr Vassie Loll. Nothing further needed at this time.

## 2020-05-23 ENCOUNTER — Other Ambulatory Visit (HOSPITAL_COMMUNITY)
Admission: RE | Admit: 2020-05-23 | Discharge: 2020-05-23 | Disposition: A | Discharge status: Home / Self Care | Payer: Medicare HMO | Source: Ambulatory Visit | Attending: Pulmonary Disease | Admitting: Pulmonary Disease

## 2020-05-23 DIAGNOSIS — Z01812 Encounter for preprocedural laboratory examination: Secondary | ICD-10-CM | POA: Insufficient documentation

## 2020-05-23 DIAGNOSIS — Z20822 Contact with and (suspected) exposure to covid-19: Secondary | ICD-10-CM | POA: Diagnosis not present

## 2020-05-24 LAB — SARS CORONAVIRUS 2 (TAT 6-24 HRS): SARS Coronavirus 2: NEGATIVE

## 2020-05-25 ENCOUNTER — Encounter: Payer: Self-pay | Admitting: Pulmonary Disease

## 2020-05-25 ENCOUNTER — Other Ambulatory Visit: Payer: Self-pay

## 2020-05-25 ENCOUNTER — Ambulatory Visit (INDEPENDENT_AMBULATORY_CARE_PROVIDER_SITE_OTHER): Payer: Medicare HMO | Admitting: Pulmonary Disease

## 2020-05-25 VITALS — BP 128/72 | HR 83 | Temp 97.1°F | Ht 62.0 in | Wt 183.0 lb

## 2020-05-25 DIAGNOSIS — J9612 Chronic respiratory failure with hypercapnia: Secondary | ICD-10-CM | POA: Diagnosis not present

## 2020-05-25 DIAGNOSIS — G4733 Obstructive sleep apnea (adult) (pediatric): Secondary | ICD-10-CM

## 2020-05-25 DIAGNOSIS — R0689 Other abnormalities of breathing: Secondary | ICD-10-CM | POA: Diagnosis not present

## 2020-05-25 DIAGNOSIS — J9611 Chronic respiratory failure with hypoxia: Secondary | ICD-10-CM

## 2020-05-25 LAB — PULMONARY FUNCTION TEST
DL/VA % pred: 101 %
DL/VA: 4.2 ml/min/mmHg/L
DLCO cor % pred: 86 %
DLCO cor: 15.25 ml/min/mmHg
DLCO unc % pred: 86 %
DLCO unc: 15.25 ml/min/mmHg
FEF 25-75 Post: 0.7 L/sec
FEF 25-75 Pre: 0.81 L/sec
FEF2575-%Change-Post: -13 %
FEF2575-%Pred-Post: 55 %
FEF2575-%Pred-Pre: 64 %
FEV1-%Change-Post: -2 %
FEV1-%Pred-Post: 85 %
FEV1-%Pred-Pre: 88 %
FEV1-Post: 1.24 L
FEV1-Pre: 1.28 L
FEV1FVC-%Change-Post: 2 %
FEV1FVC-%Pred-Pre: 92 %
FEV6-%Change-Post: -4 %
FEV6-%Pred-Post: 96 %
FEV6-%Pred-Pre: 101 %
FEV6-Post: 1.72 L
FEV6-Pre: 1.81 L
FEV6FVC-%Change-Post: 0 %
FEV6FVC-%Pred-Post: 105 %
FEV6FVC-%Pred-Pre: 104 %
FVC-%Change-Post: -5 %
FVC-%Pred-Post: 91 %
FVC-%Pred-Pre: 96 %
FVC-Post: 1.72 L
FVC-Pre: 1.82 L
Post FEV1/FVC ratio: 72 %
Post FEV6/FVC ratio: 100 %
Pre FEV1/FVC ratio: 70 %
Pre FEV6/FVC Ratio: 100 %
RV % pred: 108 %
RV: 2.44 L
TLC % pred: 91 %
TLC: 4.36 L

## 2020-05-25 NOTE — Assessment & Plan Note (Signed)
Unclear etiology, has been attributed to obesity hypoventilation but her BMI is not that high.  No evidence of significant airway obstruction on PFTs.  We will recheck ABG to see if she is still hypercarbic.  Meanwhile I have emphasized to continue on NIV and increase her compliance

## 2020-05-25 NOTE — Patient Instructions (Signed)
Check ABG at the hospital for carbon dioxide level Breathing test [facility] - use albuterol as needed  Sleep test showed mild OSA - schedule CPAP titrations tudy Meanwhile use your trilogy machine every night during sleep

## 2020-05-25 NOTE — Assessment & Plan Note (Signed)
HST shows mild OSA with AHI 10/hour but nocturnal desaturations are moderate degree suggesting some degree of hypoventilation.  We will proceed with a titration study and see if she needs BiPAP.  Based on this, we will switch her from NIV to the suggested PAP therapy.  ABG would also help to guide this change

## 2020-05-25 NOTE — Progress Notes (Signed)
   Subjective:    Patient ID: Sherry Wood, female    DOB: [birth year], 80 y.o.   MRN: [id number]  HPI  80 year old remote smoker for FU of chronic hypercarbic respiratory failure?  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 7.3 6/56/50 4/85% on room air She underwent evaluation for elevated D-dimer including CT angiogram chest which was negative, head CT was negative.  She was discharged on trilogy  NIV which was delivered to her home by [facility] with full facemask.    After her initial office visit we undertook home sleep test and PFTs, we reviewed results of these today She feels that her breathing is back to normal, she uses NIV machine "sometimes" , I have to take off the mask when I need to yawn.  I do not have any sleeping problems Oxygen saturation 95% today, she uses albuterol on an as-needed basis. Reports pedal edema, maintained on Lasix 20 mg   Significant tests/ events reviewed  PFTs 05/2020 very mild airway obstruction, ratio 70, FEV1 1.28/88%, no bronchodilator response, normal TLC, normal DLCO HST 05/2020 >> AHI 10/hour, lowest desaturation 79%, she has been 94 minutes with saturation less than 89% CTA chest 02/07/20 neg PE Echo 01/2020 normal RV SF    Review of Systems neg for any significant sore throat, dysphagia, itching, sneezing, nasal congestion or excess/ purulent secretions, fever, chills, sweats, unintended wt loss, pleuritic or exertional cp, hempoptysis, orthopnea pnd or change in chronic leg swelling. Also denies presyncope, palpitations, heartburn, abdominal pain, nausea, vomiting, diarrhea or change in bowel or urinary habits, dysuria,hematuria, rash, arthralgias, visual complaints, headache, numbness weakness or ataxia.      Objective:   Physical Exam  Gen. Pleasant, well-nourished, in no distress ENT - no thrush, no pallor/icterus,no post nasal drip Neck: No JVD, no thyromegaly, no carotid bruits Lungs: no use of accessory muscles, no dullness to percussion, clear without rales or rhonchi  Cardiovascular: Rhythm regular, heart sounds  normal, no murmurs or gallops, 1+ peripheral edema Musculoskeletal: No deformities, no cyanosis or clubbing        Assessment & Plan:

## 2020-05-31 ENCOUNTER — Encounter (HOSPITAL_COMMUNITY): Payer: Medicare HMO

## 2020-06-07 ENCOUNTER — Other Ambulatory Visit: Payer: Self-pay | Admitting: Family Medicine

## 2020-06-07 ENCOUNTER — Other Ambulatory Visit (HOSPITAL_COMMUNITY)
Admission: RE | Admit: 2020-06-07 | Discharge: 2020-06-07 | Disposition: A | Discharge status: Home / Self Care | Payer: Medicare HMO | Source: Ambulatory Visit | Attending: Family Medicine | Admitting: Family Medicine

## 2020-06-07 DIAGNOSIS — N898 Other specified noninflammatory disorders of vagina: Secondary | ICD-10-CM | POA: Insufficient documentation

## 2020-06-08 LAB — MOLECULAR ANCILLARY ONLY
Bacterial Vaginitis (gardnerella): POSITIVE — AB
Candida Glabrata: NEGATIVE
Candida Vaginitis: NEGATIVE
Chlamydia: NEGATIVE
Comment: NEGATIVE
Comment: NEGATIVE
Comment: NEGATIVE
Comment: NEGATIVE
Comment: NEGATIVE
Comment: NORMAL
Neisseria Gonorrhea: NEGATIVE
Trichomonas: NEGATIVE

## 2020-06-09 ENCOUNTER — Other Ambulatory Visit: Payer: Self-pay

## 2020-06-09 ENCOUNTER — Ambulatory Visit (HOSPITAL_COMMUNITY)
Admission: RE | Admit: 2020-06-09 | Discharge: 2020-06-09 | Disposition: A | Discharge status: Home / Self Care | Payer: Medicare HMO | Source: Ambulatory Visit | Attending: Pulmonary Disease | Admitting: Pulmonary Disease

## 2020-06-09 DIAGNOSIS — R0689 Other abnormalities of breathing: Secondary | ICD-10-CM | POA: Diagnosis present

## 2020-06-09 LAB — BLOOD GAS, ARTERIAL
Acid-Base Excess: 3.1 mmol/L — ABNORMAL HIGH (ref 0.0–2.0)
Bicarbonate: 27.5 mmol/L (ref 20.0–28.0)
FIO2: 21
O2 Saturation: 94 %
Patient temperature: 37
pCO2 arterial: 45.3 mmHg (ref 32.0–48.0)
pH, Arterial: 7.401 (ref 7.350–7.450)
pO2, Arterial: 70.5 mmHg — ABNORMAL LOW (ref 83.0–108.0)

## 2020-06-09 NOTE — Progress Notes (Signed)
Called and went over blood gas results per Dr Vassie Loll with patient. All questions answered and patient expressed full understanding. Nothing further needed at this time.

## 2020-06-09 NOTE — Progress Notes (Signed)
Pt in today for RA ABG.  Done and results in computer

## 2020-07-24 ENCOUNTER — Other Ambulatory Visit: Payer: Self-pay

## 2020-07-24 ENCOUNTER — Ambulatory Visit (HOSPITAL_BASED_OUTPATIENT_CLINIC_OR_DEPARTMENT_OTHER): Payer: Medicare HMO | Attending: Pulmonary Disease | Admitting: Pulmonary Disease

## 2020-07-24 DIAGNOSIS — G4736 Sleep related hypoventilation in conditions classified elsewhere: Secondary | ICD-10-CM | POA: Insufficient documentation

## 2020-07-24 DIAGNOSIS — G4733 Obstructive sleep apnea (adult) (pediatric): Secondary | ICD-10-CM | POA: Diagnosis not present

## 2020-07-24 DIAGNOSIS — R0902 Hypoxemia: Secondary | ICD-10-CM | POA: Diagnosis not present

## 2020-07-24 DIAGNOSIS — R0689 Other abnormalities of breathing: Secondary | ICD-10-CM | POA: Diagnosis present

## 2020-07-24 DIAGNOSIS — J9612 Chronic respiratory failure with hypercapnia: Secondary | ICD-10-CM

## 2020-07-24 DIAGNOSIS — J9611 Chronic respiratory failure with hypoxia: Secondary | ICD-10-CM

## 2020-07-25 ENCOUNTER — Telehealth: Payer: Self-pay | Admitting: Pulmonary Disease

## 2020-07-25 DIAGNOSIS — J9612 Chronic respiratory failure with hypercapnia: Secondary | ICD-10-CM

## 2020-07-25 DIAGNOSIS — G4733 Obstructive sleep apnea (adult) (pediatric): Secondary | ICD-10-CM

## 2020-07-25 DIAGNOSIS — R0689 Other abnormalities of breathing: Secondary | ICD-10-CM

## 2020-07-25 DIAGNOSIS — J9611 Chronic respiratory failure with hypoxia: Secondary | ICD-10-CM | POA: Diagnosis not present

## 2020-07-25 NOTE — Telephone Encounter (Signed)
Tried calling pt and there was no answer- LMTCB.  

## 2020-07-25 NOTE — Procedures (Signed)
Patient Name: Sherry Wood, Sherry Wood Date: 07/24/2020 Gender: Female D.O.B: 08-08-1940 Age (years): 68 Referring Provider: Cyril Mourning MD, ABSM Height (inches): 61 Interpreting Physician: Cyril Mourning MD, ABSM Weight (lbs): 190 RPSGT: Rolene Arbour BMI: 36 MRN: [id number] Neck Size: 15.50 <br> <br> CLINICAL INFORMATION Sleep Study Type: NPSG    Indication for sleep study: Hypertension , acute on chronic hypercarbic respiratory failure on NIV HST 05/2020 >> AHI 10/hour, lowest desaturation 79%,  94 minutes with saturation less than 89%    Epworth Sleepiness Score: 7    SLEEP STUDY TECHNIQUE As per the AASM Manual for the Scoring of Sleep and Associated Events v2.3 (April 2016) with a hypopnea requiring 4% desaturations.  The channels recorded and monitored were frontal, central and occipital EEG, electrooculogram (EOG), submentalis EMG (chin), nasal and oral airflow, thoracic and abdominal wall motion, anterior tibialis EMG, snore microphone, electrocardiogram, and pulse oximetry.  MEDICATIONS Medications self-administered by patient taken the night of the study : AMLODIPINE  SLEEP ARCHITECTURE The study was initiated at 10:13:52 PM and ended at 4:43:55 AM.  Sleep onset time was 6.3 minutes and the sleep efficiency was 75.9%%. The total sleep time was 296.2 minutes.  Stage REM latency was 283.0 minutes.  The patient spent 2.7%% of the night in stage N1 sleep, 81.6%% in stage N2 sleep, 0.0%% in stage N3 and 15.7% in REM.  Alpha intrusion was absent.  Supine sleep was 45.27%.  RESPIRATORY PARAMETERS The overall apnea/hypopnea index (AHI) was 5.5 per hour. There were 4 total apneas, including 4 obstructive, 0 central and 0 mixed apneas. There were 23 hypopneas and 10 RERAs.  The AHI during Stage REM sleep was 9.0 per hour.  AHI while supine was 7.2 per hour.  The mean oxygen saturation was 90.1%. The minimum SpO2 during sleep was 72.0%. She spent 122 mins with sat  less than 89%. O2 1 L was added around 2-30 am  moderate snoring was noted during this study.  CARDIAC DATA The 2 lead EKG demonstrated sinus rhythm. The mean heart rate was 77.8 beats per minute. Other EKG findings include: PVCs.  LEG MOVEMENT DATA The total PLMS were 0 with a resulting PLMS index of 0.0. Associated arousal with leg movement index was 0.0 .  IMPRESSIONS - Mild obstructive sleep apnea occurred during this study (AHI = 5.5/h). - Moderate oxygen desaturation was noted during this study (Min O2 = 72.0%). - The patient snored with moderate snoring volume. - EKG findings include PVCs. - Clinically significant periodic limb movements did not occur during sleep. No significant associated arousals.   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 1 L O2 was added   RECOMMENDATIONS - Positional therapy avoiding supine position during sleep. - Very mild obstructive sleep apnea. Return to discuss treatment options. - 1L Oxygen can be added. Alternatively, since she is on NIV, nocturnal oximetry should be checked to see if she requires additional O2. - Avoid alcohol, sedatives and other CNS depressants that may worsen sleep apnea and disrupt normal sleep architecture. - Sleep hygiene should be reviewed to assess factors that may improve sleep quality. - Weight management and regular exercise should be initiated or continued if appropriate.   Cyril Mourning MD Board Certified in Sleep medicine

## 2020-07-25 NOTE — Telephone Encounter (Signed)
Very mild OSA was noted on sleep study Oxygen 1 L was added  Since she is on NIV already, would suggest checking ONO on NIV/RA  Also repeat ABG on RA to see if her high CO2 has normalised.  OV after these 2 tests

## 2020-07-26 NOTE — Telephone Encounter (Signed)
Called and spoke with Patient.  Dr. Reginia Naas results and recommendations given. Understanding stated. ABG and ONO orders placed.  Nothing further at this time.

## 2020-08-03 ENCOUNTER — Ambulatory Visit (HOSPITAL_COMMUNITY)
Admission: RE | Admit: 2020-08-03 | Discharge: 2020-08-03 | Disposition: A | Discharge status: Home / Self Care | Payer: Medicare HMO | Source: Ambulatory Visit | Attending: Pulmonary Disease | Admitting: Pulmonary Disease

## 2020-08-03 DIAGNOSIS — R0689 Other abnormalities of breathing: Secondary | ICD-10-CM

## 2020-08-03 LAB — BLOOD GAS, ARTERIAL
Acid-Base Excess: 4.5 mmol/L — ABNORMAL HIGH (ref 0.0–2.0)
Bicarbonate: 28.9 mmol/L — ABNORMAL HIGH (ref 20.0–28.0)
Drawn by: 21179
FIO2: 21
O2 Saturation: 95.8 %
Patient temperature: 37
pCO2 arterial: 45.9 mmHg (ref 32.0–48.0)
pH, Arterial: 7.415 (ref 7.350–7.450)
pO2, Arterial: 80.7 mmHg — ABNORMAL LOW (ref 83.0–108.0)

## 2020-08-03 NOTE — Progress Notes (Signed)
Called the pt and there was no answer- LMTCB    

## 2020-08-03 NOTE — Progress Notes (Signed)
Patient in today for ABG on RA.  Results in computer.

## 2020-08-08 ENCOUNTER — Telehealth: Payer: Self-pay | Admitting: Pulmonary Disease

## 2020-08-08 DIAGNOSIS — G4733 Obstructive sleep apnea (adult) (pediatric): Secondary | ICD-10-CM

## 2020-08-08 DIAGNOSIS — R0689 Other abnormalities of breathing: Secondary | ICD-10-CM

## 2020-08-08 NOTE — Telephone Encounter (Signed)
Received ONO on RA 08/03/20 done by [facility]   Desat <88% for 4 hr 11 min <89% 5 hr 23 min   Looks like the test was supposed to have been done on her NIV/RA  She states no one told her this, and the test was done only on RA  She did not use vent at all   I told her that since the test was not done the way we ordered it, it may need to be repeated  She refused to have this rescheduled    Will forward to Dr Vassie Loll to let him know and see if there is anything else he rec  Thanks

## 2020-08-09 ENCOUNTER — Encounter: Payer: Self-pay | Admitting: *Deleted

## 2020-08-11 NOTE — Telephone Encounter (Signed)
Please let her know that I needed test to be done on NIV to see if she needsoxygen + NIV    Spoke with the pt and notified her of response per Dr Vassie Loll  She verbalized understanding  She was agreeable to repeat ONO, test was ordered

## 2020-08-17 ENCOUNTER — Telehealth: Payer: Medicare HMO | Admitting: Pulmonary Disease

## 2020-08-17 NOTE — Telephone Encounter (Signed)
I called and spoke with patient  with patient who wanted to verify that she is to wear NIV while doing ONO tonight. In looking at the notes, she is supposed to wear it with the ONO as it was done on RA and was not right. Patient verbalized understanding, nothing further needed.

## 2020-08-22 ENCOUNTER — Telehealth: Payer: Self-pay | Admitting: Pulmonary Disease

## 2020-08-22 NOTE — Telephone Encounter (Signed)
ONO on NIV/ RA showed desaturation for 2.5 hours  Suggest that we continue oxygen blended into NIV during sleep

## 2020-08-25 NOTE — Telephone Encounter (Signed)
Pt is requesting ONO results.  Pt is requesting to be reached at [phone]

## 2020-08-25 NOTE — Telephone Encounter (Signed)
Called and spoke with patient. She verbalized understanding of results.  ? ?Nothing further needed at time of call.  ?

## 2021-02-15 DIAGNOSIS — R6889 Other general symptoms and signs: Secondary | ICD-10-CM | POA: Diagnosis not present

## 2021-02-17 DIAGNOSIS — Z6834 Body mass index (BMI) 34.0-34.9, adult: Secondary | ICD-10-CM | POA: Diagnosis not present

## 2021-02-17 DIAGNOSIS — H409 Unspecified glaucoma: Secondary | ICD-10-CM | POA: Diagnosis not present

## 2021-02-17 DIAGNOSIS — J45909 Unspecified asthma, uncomplicated: Secondary | ICD-10-CM | POA: Diagnosis not present

## 2021-02-17 DIAGNOSIS — Z7722 Contact with and (suspected) exposure to environmental tobacco smoke (acute) (chronic): Secondary | ICD-10-CM | POA: Diagnosis not present

## 2021-02-17 DIAGNOSIS — R32 Unspecified urinary incontinence: Secondary | ICD-10-CM | POA: Diagnosis not present

## 2021-02-17 DIAGNOSIS — E669 Obesity, unspecified: Secondary | ICD-10-CM | POA: Diagnosis not present

## 2021-02-17 DIAGNOSIS — Z880 Allergy status to penicillin: Secondary | ICD-10-CM | POA: Diagnosis not present

## 2021-02-17 DIAGNOSIS — G4733 Obstructive sleep apnea (adult) (pediatric): Secondary | ICD-10-CM | POA: Diagnosis not present

## 2021-02-17 DIAGNOSIS — Z87891 Personal history of nicotine dependence: Secondary | ICD-10-CM | POA: Diagnosis not present

## 2021-02-17 DIAGNOSIS — M810 Age-related osteoporosis without current pathological fracture: Secondary | ICD-10-CM | POA: Diagnosis not present

## 2021-02-17 DIAGNOSIS — I10 Essential (primary) hypertension: Secondary | ICD-10-CM | POA: Diagnosis not present

## 2021-02-21 DIAGNOSIS — H25812 Combined forms of age-related cataract, left eye: Secondary | ICD-10-CM | POA: Diagnosis not present

## 2021-02-21 DIAGNOSIS — Z961 Presence of intraocular lens: Secondary | ICD-10-CM | POA: Diagnosis not present

## 2021-02-21 DIAGNOSIS — H40023 Open angle with borderline findings, high risk, bilateral: Secondary | ICD-10-CM | POA: Diagnosis not present

## 2021-02-21 DIAGNOSIS — H2512 Age-related nuclear cataract, left eye: Secondary | ICD-10-CM | POA: Diagnosis not present

## 2021-03-07 DIAGNOSIS — H40023 Open angle with borderline findings, high risk, bilateral: Secondary | ICD-10-CM | POA: Diagnosis not present

## 2021-03-07 DIAGNOSIS — H25812 Combined forms of age-related cataract, left eye: Secondary | ICD-10-CM | POA: Diagnosis not present

## 2021-04-11 DIAGNOSIS — H25812 Combined forms of age-related cataract, left eye: Secondary | ICD-10-CM | POA: Diagnosis not present

## 2021-05-25 DIAGNOSIS — E782 Mixed hyperlipidemia: Secondary | ICD-10-CM | POA: Diagnosis not present

## 2021-05-25 DIAGNOSIS — R7309 Other abnormal glucose: Secondary | ICD-10-CM | POA: Diagnosis not present

## 2021-05-25 DIAGNOSIS — I10 Essential (primary) hypertension: Secondary | ICD-10-CM | POA: Diagnosis not present

## 2021-07-17 DIAGNOSIS — Z961 Presence of intraocular lens: Secondary | ICD-10-CM | POA: Diagnosis not present

## 2021-07-17 DIAGNOSIS — H40023 Open angle with borderline findings, high risk, bilateral: Secondary | ICD-10-CM | POA: Diagnosis not present

## 2021-09-28 DIAGNOSIS — R609 Edema, unspecified: Secondary | ICD-10-CM | POA: Diagnosis not present

## 2021-09-28 DIAGNOSIS — G4730 Sleep apnea, unspecified: Secondary | ICD-10-CM | POA: Diagnosis not present

## 2021-09-28 DIAGNOSIS — Z6836 Body mass index (BMI) 36.0-36.9, adult: Secondary | ICD-10-CM | POA: Diagnosis not present

## 2021-09-28 DIAGNOSIS — M130 Polyarthritis, unspecified: Secondary | ICD-10-CM | POA: Diagnosis not present

## 2021-09-28 DIAGNOSIS — I509 Heart failure, unspecified: Secondary | ICD-10-CM | POA: Diagnosis not present

## 2021-09-28 DIAGNOSIS — E669 Obesity, unspecified: Secondary | ICD-10-CM | POA: Diagnosis not present

## 2021-09-28 DIAGNOSIS — R7309 Other abnormal glucose: Secondary | ICD-10-CM | POA: Diagnosis not present

## 2021-11-02 DIAGNOSIS — M130 Polyarthritis, unspecified: Secondary | ICD-10-CM | POA: Diagnosis not present

## 2021-11-02 DIAGNOSIS — E782 Mixed hyperlipidemia: Secondary | ICD-10-CM | POA: Diagnosis not present

## 2021-11-02 DIAGNOSIS — E669 Obesity, unspecified: Secondary | ICD-10-CM | POA: Diagnosis not present

## 2021-11-02 DIAGNOSIS — I10 Essential (primary) hypertension: Secondary | ICD-10-CM | POA: Diagnosis not present

## 2021-11-02 DIAGNOSIS — N3281 Overactive bladder: Secondary | ICD-10-CM | POA: Diagnosis not present

## 2021-11-13 DIAGNOSIS — B3731 Acute candidiasis of vulva and vagina: Secondary | ICD-10-CM | POA: Diagnosis not present

## 2021-11-13 DIAGNOSIS — Z01411 Encounter for gynecological examination (general) (routine) with abnormal findings: Secondary | ICD-10-CM | POA: Diagnosis not present

## 2021-11-13 DIAGNOSIS — Z1231 Encounter for screening mammogram for malignant neoplasm of breast: Secondary | ICD-10-CM | POA: Diagnosis not present

## 2021-11-13 DIAGNOSIS — Z6837 Body mass index (BMI) 37.0-37.9, adult: Secondary | ICD-10-CM | POA: Diagnosis not present

## 2021-12-13 ENCOUNTER — Encounter (HOSPITAL_BASED_OUTPATIENT_CLINIC_OR_DEPARTMENT_OTHER): Payer: Self-pay | Admitting: Pulmonary Disease

## 2021-12-13 ENCOUNTER — Ambulatory Visit (INDEPENDENT_AMBULATORY_CARE_PROVIDER_SITE_OTHER): Payer: Medicare HMO | Admitting: Pulmonary Disease

## 2021-12-13 VITALS — BP 126/78 | HR 77 | Temp 98.3°F | Ht 61.0 in | Wt 192.2 lb

## 2021-12-13 DIAGNOSIS — G4733 Obstructive sleep apnea (adult) (pediatric): Secondary | ICD-10-CM

## 2021-12-13 DIAGNOSIS — J9612 Chronic respiratory failure with hypercapnia: Secondary | ICD-10-CM

## 2021-12-13 DIAGNOSIS — J9611 Chronic respiratory failure with hypoxia: Secondary | ICD-10-CM | POA: Diagnosis not present

## 2021-12-13 DIAGNOSIS — Z800 Family history of malignant neoplasm of digestive organs: Secondary | ICD-10-CM | POA: Diagnosis not present

## 2021-12-13 NOTE — Assessment & Plan Note (Addendum)
Very mild and does not need treatment

## 2021-12-13 NOTE — Progress Notes (Signed)
   Subjective:    Patient ID: Sherry Wood, female    DOB: 08/10/1940, 81 y.o.   MRN: [id number]  HPI  81 yo remote smoker for FU of chronic hypercarbic respiratory failure?  Etiology Presumptive diagnosis was chronic hypercarbic respiratory failure  She quit smoking in the 70s, less than 10 pack years   She presented to [facility] long ED on 02/07/2020 for dizziness and was found to be hypoxic to mid 80s on room air   ABG was 7.3 3/62/10 1/97% on 28% FiO2. 7.3 6/56/50 4/85% on room air She underwent evaluation for elevated D-dimer including CT angiogram chest which was negative, head CT was negative.  She was discharged on trilogy  NIV which was delivered to her home by [facility] with full facemask.    Chief Complaint  Patient presents with   Follow-up    Pt states she has been doing okay since last visit.   Last OV was 05/2020 She was set up with NIV in January 2022 after hospital admission she has been unable to use this She was never set up with oxygen although nocturnal oximetry showed persistent desaturation.  She denies dyspnea.  She admits to sedentary lifestyle and does not walk much. 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  Significant tests/ events reviewed  ABG 07/2020 (outpt) 7.41/46/81  ONO on RA 08/03/20  Desat <88% for 4 hr 11 min <89% 5 hr 23 min  ONO on NIV/RA  showed desaturation for 2.5 hours >> ct O2  PFTs 05/2020 very mild airway obstruction, ratio 70, FEV1 1.28/88%, no bronchodilator response, normal TLC, normal DLCO  NPSG 07/2020 mild OSA, AHI 5.5/hour, nocturnal hypoxia >> 1 L oxygen was added HST 05/2020 >> AHI 10/hour, lowest desaturation 79%, she has been 94 minutes with saturation less than 89% CTA chest 02/07/20 neg PE Echo 01/2020 normal RV SF  Review of Systems neg for any significant sore throat, dysphagia, itching, sneezing, nasal congestion or excess/ purulent secretions, fever, chills, sweats, unintended wt loss, pleuritic or exertional cp, hempoptysis,  orthopnea pnd or change in chronic leg swelling. Also denies presyncope, palpitations, heartburn, abdominal pain, nausea, vomiting, diarrhea or change in bowel or urinary habits, dysuria,hematuria, rash, arthralgias, visual complaints, headache, numbness weakness or ataxia.     Objective:   Physical Exam  Gen. Pleasant, obese, in no distress ENT - no lesions, no post nasal drip Neck: No JVD, no thyromegaly, no carotid bruits Lungs: no use of accessory muscles, no dullness to percussion, decreased without rales or rhonchi  Cardiovascular: Rhythm regular, heart sounds  normal, no murmurs or gallops, no peripheral edema Musculoskeletal: No deformities, no cyanosis or clubbing , no tremors       Assessment & Plan:

## 2021-12-13 NOTE — Patient Instructions (Addendum)
X amb sat X discontinue NIV  X check ONO on RA  -based on this we will decide if you need oxygen during sleep

## 2021-12-13 NOTE — Assessment & Plan Note (Signed)
Unclear etiology but appears to have been mild and follow-up ABG did not show significant hypercarbia.  She has been unable to tolerate NIV. We will discontinue. We will reassess for hypoxia with nocturnal oximetry -she does not seem to have definite OSA and PFTs did not show significant airway obstruction, she is a remote smoker and not feel the need for bronchodilators. No evidence of pulm hypertension on echo

## 2021-12-27 ENCOUNTER — Other Ambulatory Visit: Payer: Self-pay

## 2021-12-27 DIAGNOSIS — J9611 Chronic respiratory failure with hypoxia: Secondary | ICD-10-CM

## 2021-12-27 DIAGNOSIS — R0689 Other abnormalities of breathing: Secondary | ICD-10-CM

## 2021-12-28 ENCOUNTER — Other Ambulatory Visit: Payer: Self-pay

## 2021-12-28 DIAGNOSIS — G4733 Obstructive sleep apnea (adult) (pediatric): Secondary | ICD-10-CM

## 2022-01-04 DIAGNOSIS — E669 Obesity, unspecified: Secondary | ICD-10-CM | POA: Diagnosis not present

## 2022-01-04 DIAGNOSIS — I10 Essential (primary) hypertension: Secondary | ICD-10-CM | POA: Diagnosis not present

## 2022-01-04 DIAGNOSIS — R7303 Prediabetes: Secondary | ICD-10-CM | POA: Diagnosis not present

## 2022-01-04 DIAGNOSIS — G4730 Sleep apnea, unspecified: Secondary | ICD-10-CM | POA: Diagnosis not present

## 2022-01-12 NOTE — Progress Notes (Deleted)
Cardiology Office Note:    Date:  01/12/2022   ID:  Sherry Wood, DOB September 09, 1940, MRN [id number]  PCP:  Katherina Mires, MD   [place] [facility] Cardiologist:  Lenna Sciara, MD Referring MD: Rigoberto Noel, MD   Chief Complaint/Reason for Referral:  ***  ASSESSMENT:    No diagnosis found.  PLAN:    In order of problems listed above:          {Are you ordering a CV Procedure (e.g. stress test, cath, DCCV, TEE, etc)?   Press F2        :[id number]   Dispo:  No follow-ups on file.      Medication Adjustments/Labs and Tests Ordered: Current medicines are reviewed at length with the patient today.  Concerns regarding medicines are outlined above.  The following changes have been made:  {PLAN; NO CHANGE:13088:s}   Labs/tests ordered: No orders of the defined types were placed in this encounter.   Medication Changes: No orders of the defined types were placed in this encounter.    Current medicines are reviewed at length with the patient today.  The patient {ACTIONS; HAS/DOES NOT HAVE:19233} concerns regarding medicines.   History of Present Illness:    FOCUSED PROBLEM LIST:   ***  The patient is a 82 y.o. female with the indicated medical history here for***       Previous Medical History: Past Medical History:  Diagnosis Date   Asthma    Gout    Hypertension    Parotid swelling    Intermittent since 2017, referred to ENT, thought maybe sialiadenitis   Pre-diabetes   ***   Current Medications: No outpatient medications have been marked as taking for the 01/17/22 encounter (Appointment) with Early Osmond, MD.     Allergies:    Aloe vera, Latex, Naproxen, and Penicillins   Social History:   Social History   Tobacco Use   Smoking status: Former    Years: 12.00    Types: Cigarettes    Quit date: 1972    Years since quitting: 52.0   Smokeless tobacco: Never   Tobacco comments:    1-2 cigarettes a day  Vaping Use   Vaping Use:  Never used  Substance Use Topics   Alcohol use: No   Drug use: No     Family Hx: Family History  Problem Relation Age of Onset   Diabetes Mother    Cancer Mother    Asthma Sister    Cancer Sister        Bone   Hypertension Sister    Diabetes Sister      Review of Systems:   Please see the history of present illness.    All other systems reviewed and are negative.     EKGs/Labs/Other Test Reviewed:    EKG:  EKG performed *** that I personally reviewed demonstrates ***; EKG performed today that I personally reviewed demonstrates ***.  Prior CV studies: *** {Select studies to display:26339}   Other studies Reviewed: Review of the additional studies/records demonstrates: ***  Recent Labs: No results found for requested labs within last 365 days.   Recent Lipid Panel No results found for: "CHOL", "TRIG", "HDL", "LDLCALC", "LDLDIRECT"  Risk Assessment/Calculations:    {Does this patient have ATRIAL FIBRILLATION?:[phone]}      No BP recorded.  {Refresh Note OR Click here to enter BP  :1}***    Physical Exam:    VS:  There were no vitals taken for  this visit.   Wt Readings from Last 3 Encounters:  12/13/21 192 lb 3.2 oz (87.2 kg)  07/24/20 190 lb (86.2 kg)  05/25/20 183 lb (83 kg)    GENERAL:  No apparent distress, AOx3 HEENT:  No carotid bruits, +2 carotid impulses, no scleral icterus CAR: RRR Irregular RR*** no murmurs***, gallops, rubs, or thrills RES:  Clear to auscultation bilaterally ABD:  Soft, nontender, nondistended, positive bowel sounds x 4 VASC:  +2 radial pulses, +2 carotid pulses, palpable pedal pulses NEURO:  CN 2-12 grossly intact; motor and sensory grossly intact PSYCH:  No active depression or anxiety EXT:  No edema, ecchymosis, or cyanosis  Signed, Early Osmond, MD  01/12/2022 9:24 AM    [address], [place], [place]  [address] Phone: [phone]; Fax: [phone]   Note:  This document  was prepared using Dragon voice recognition software and may include unintentional dictation errors.

## 2022-01-17 ENCOUNTER — Ambulatory Visit: Payer: Medicare HMO | Admitting: Internal Medicine

## 2022-01-19 DIAGNOSIS — G4730 Sleep apnea, unspecified: Secondary | ICD-10-CM | POA: Diagnosis not present

## 2022-01-19 DIAGNOSIS — R0683 Snoring: Secondary | ICD-10-CM | POA: Diagnosis not present

## 2022-02-06 ENCOUNTER — Telehealth: Payer: Self-pay | Admitting: Pulmonary Disease

## 2022-02-06 NOTE — Telephone Encounter (Signed)
ONO on room air showed desaturation 2.5 hours less than 88%. We can set her up with 2 L oxygen to use during sleep. She has been unable to tolerate NIV.  If she is interested we can set up CPAP titration study to see if that will help her better

## 2022-02-13 ENCOUNTER — Encounter: Payer: Self-pay | Admitting: Pulmonary Disease

## 2022-03-08 ENCOUNTER — Ambulatory Visit (HOSPITAL_COMMUNITY)
Admission: EM | Admit: 2022-03-08 | Discharge: 2022-03-08 | Disposition: A | Discharge status: Home / Self Care | Payer: Medicare HMO | Attending: Family Medicine | Admitting: Family Medicine

## 2022-03-08 ENCOUNTER — Encounter (HOSPITAL_COMMUNITY): Payer: Self-pay

## 2022-03-08 DIAGNOSIS — R062 Wheezing: Secondary | ICD-10-CM | POA: Diagnosis present

## 2022-03-08 DIAGNOSIS — Z1152 Encounter for screening for COVID-19: Secondary | ICD-10-CM | POA: Insufficient documentation

## 2022-03-08 DIAGNOSIS — Z87891 Personal history of nicotine dependence: Secondary | ICD-10-CM | POA: Diagnosis not present

## 2022-03-08 DIAGNOSIS — J209 Acute bronchitis, unspecified: Secondary | ICD-10-CM | POA: Diagnosis not present

## 2022-03-08 DIAGNOSIS — R059 Cough, unspecified: Secondary | ICD-10-CM | POA: Diagnosis present

## 2022-03-08 MED ORDER — PREDNISONE 20 MG PO TABS: 40.0000 mg | ORAL_TABLET | Freq: Every day | ORAL | 0 refills | Status: AC

## 2022-03-08 MED ORDER — AZITHROMYCIN 250 MG PO TABS: 250.0000 mg | ORAL_TABLET | Freq: Every day | ORAL | 0 refills | Status: AC

## 2022-03-08 NOTE — ED Provider Notes (Signed)
[facility]    CSN: [id number] Arrival date & time: 03/08/22  1339      History   Chief Complaint Chief Complaint  Patient presents with   Cough    HPI Sherry Wood is a 82 y.o. female.   Patient is here for a cough x 1 week.  She thinks this feels like bronchitis.  Her phlegm is white.  She does have mild wheezing, and the inhaler does help with this.  No sob.  Mild runny nose.  She was feeling much better yesterday, and today woke up with more sinus congestion.  No fever/chills, but feeling hot at times.  She has been taking theraflu with help.  She did go to a funeral about 10 days ago, not sure about sick contacts.  She would like a covid swab today if possible.  She has had bronchitis x 2 in the past.        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 Authorizing Provider  amLODipine (NORVASC) 5 MG tablet Take by mouth daily.   Yes [provider]  albuterol (VENTOLIN HFA) 108 (90 Base) MCG/ACT inhaler Inhale 1-2 puffs into the lungs every 6 (six)  hours as needed for wheezing or shortness of breath.    [provider]  calcium-vitamin D (OSCAL WITH D) 500-200 MG-UNIT tablet Take 1 tablet by mouth daily with breakfast.    [provider]  furosemide (LASIX) 20 MG tablet Take 20 mg by mouth daily. 12/13/15   [provider]  hydrochlorothiazide (MICROZIDE) 12.5 MG capsule Take by mouth. 03/22/20   [provider]  metoprolol tartrate (LOPRESSOR) 100 MG tablet Take 100 mg by mouth 2 (two) times daily. 07/03/16   [provider]    Family History Family History  Problem Relation Age of Onset   Diabetes Mother    Cancer Mother    Asthma Sister    Cancer Sister        Bone   Hypertension Sister    Diabetes Sister     Social History Social History   Tobacco Use   Smoking status: Former    Years: 12.00    Types: Cigarettes    Quit date: 1972    Years  since quitting: 52.1   Smokeless tobacco: Never   Tobacco comments:    1-2 cigarettes a day  Vaping Use   Vaping Use: Never used  Substance Use Topics   Alcohol use: No   Drug use: No     Allergies   Aloe vera, Latex, Naproxen, and Penicillins   Review of Systems Review of Systems  Constitutional:  Negative for chills and fever.  HENT:  Positive for congestion and rhinorrhea.   Respiratory:  Positive for cough and wheezing.   Gastrointestinal: Negative.   Genitourinary: Negative.   Musculoskeletal: Negative.   Psychiatric/Behavioral: Negative.       Physical Exam Triage Vital Signs ED Triage Vitals  Enc Vitals Group     BP 03/08/22 1447 (!) 158/90     Pulse Rate 03/08/22 1447 79     Resp 03/08/22 1447 18     Temp 03/08/22 1447 98.1 F (36.7 C)     Temp Source 03/08/22 1447 Oral     SpO2 03/08/22 1447 (!) 18 %     Weight --      Height --      Head Circumference --      Peak Flow --      Pain Score 03/08/22 1448 0     Pain Loc --      Pain Edu? --      Excl. in [place]? --    No data found.  Updated Vital  Signs BP (!) 158/90 (BP Location: Right Arm)   Pulse 79   Temp 98.1 F (36.7 C) (Oral)   Resp 18   SpO2 (!) 18%   Visual Acuity Right Eye Distance:   Left Eye Distance:   Bilateral Distance:    Right Eye Near:   Left Eye Near:    Bilateral Near:     Physical Exam Constitutional:      Appearance: Normal appearance.  HENT:     Nose: Nose normal.     Mouth/Throat:     Mouth: Mucous membranes are moist.     Pharynx: No posterior oropharyngeal erythema.  Cardiovascular:     Rate and Rhythm: Normal rate and regular rhythm.  Pulmonary:     Effort: Pulmonary effort is normal.     Breath sounds: Wheezing present.     Comments: Slight wheezes noted Musculoskeletal:     Cervical back: Normal range of motion and neck supple. No tenderness.  Skin:    General: Skin is warm.  Neurological:     General: No focal deficit present.     Mental Status: She is alert.  Psychiatric:        Mood and Affect: Mood normal.      UC Treatments / Results  Labs (all labs ordered are listed, but only abnormal results are displayed) Labs Reviewed  SARS CORONAVIRUS 2 (TAT 6-24 HRS)    EKG   Radiology No results found.  Procedures Procedures (including critical care time)  Medications Ordered in UC Medications - No data to display  Initial Impression / Assessment and Plan / UC Course  I have reviewed the triage vital signs and the nursing notes.  Pertinent labs & imaging results that were available during my care of the patient were reviewed by me and considered in my medical decision making (see chart for details).   Patient seen today for URI symptoms.  Treated for bronchitis today.  She is requesting a covid test.  Discussed she is outside the window for treatment today  but she would still like this done.   Final Clinical Impressions(s) / UC Diagnoses   Final diagnoses:  Acute bronchitis, unspecified organism     Discharge Instructions      You were seen today for  bronchitis. I have sent out 5 days of an antibiotic and prednisone.  You have been swabbed for covid today as well.  This will be resulted tomorrow and you will be notified if positive.  As discussed you are outside the window for treatment with any anti-virals.  Please get plenty of rest and fluids.  Follow up if not improving.     ED Prescriptions     Medication Sig Dispense Auth. Provider   predniSONE (DELTASONE) 20 MG tablet Take 2 tablets (40 mg total) by mouth daily for 5 days. 10 tablet Sahej Schrieber, MD   azithromycin (ZITHROMAX) 250 MG tablet Take 1 tablet (250 mg total) by mouth daily. Take first 2 tablets together, then 1 every day until finished. 6 tablet Rondel Oh, MD      PDMP not reviewed this encounter.   Rondel Oh, MD 03/08/22 [phone]

## 2022-03-08 NOTE — ED Triage Notes (Signed)
Patient reports that she has had a a productive cough with white sputum x 1 week.  Patient states she has been taking Theraflu and the last dose was yesterday.

## 2022-03-08 NOTE — Discharge Instructions (Signed)
You were seen today for bronchitis. I have sent out 5 days of an antibiotic and prednisone.  You have been swabbed for covid today as well.  This will be resulted tomorrow and you will be notified if positive.  As discussed you are outside the window for treatment with any anti-virals.  Please get plenty of rest and fluids.  Follow up if not improving.

## 2022-03-09 LAB — SARS CORONAVIRUS 2 (TAT 6-24 HRS): SARS Coronavirus 2: NEGATIVE

## 2022-04-05 DIAGNOSIS — M130 Polyarthritis, unspecified: Secondary | ICD-10-CM | POA: Diagnosis not present

## 2022-04-05 DIAGNOSIS — I10 Essential (primary) hypertension: Secondary | ICD-10-CM | POA: Diagnosis not present

## 2022-04-05 DIAGNOSIS — R7303 Prediabetes: Secondary | ICD-10-CM | POA: Diagnosis not present

## 2022-04-05 DIAGNOSIS — R600 Localized edema: Secondary | ICD-10-CM | POA: Diagnosis not present

## 2022-04-19 DIAGNOSIS — Z961 Presence of intraocular lens: Secondary | ICD-10-CM | POA: Diagnosis not present

## 2022-04-19 DIAGNOSIS — H40023 Open angle with borderline findings, high risk, bilateral: Secondary | ICD-10-CM | POA: Diagnosis not present

## 2022-04-29 IMAGING — MG DIGITAL SCREENING BILAT W/ TOMO W/ CAD
6 of 10 series · 6 of 30 positions shown · non-contrast
Comparison: Previous exam(s).

CLINICAL DATA: Screening.

EXAM:
DIGITAL SCREENING BILATERAL MAMMOGRAM WITH TOMO AND CAD

[L CC synth-2D (1 of 2)]
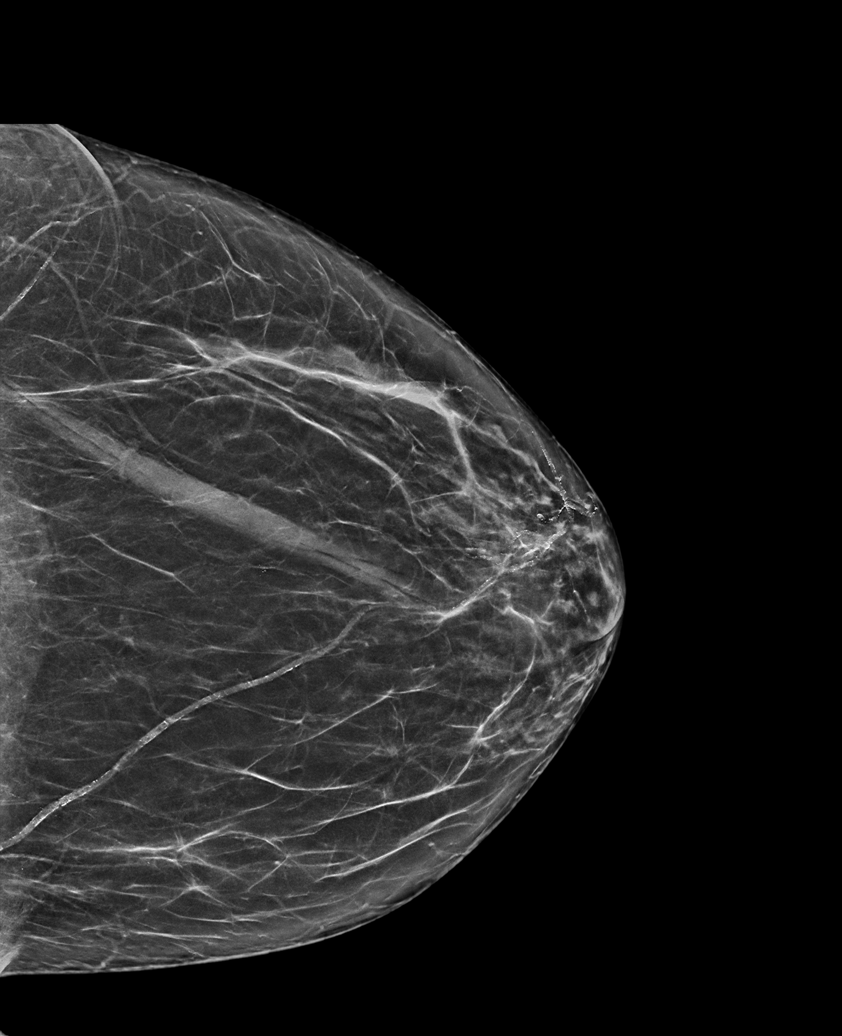

[L MLO synth-2D]
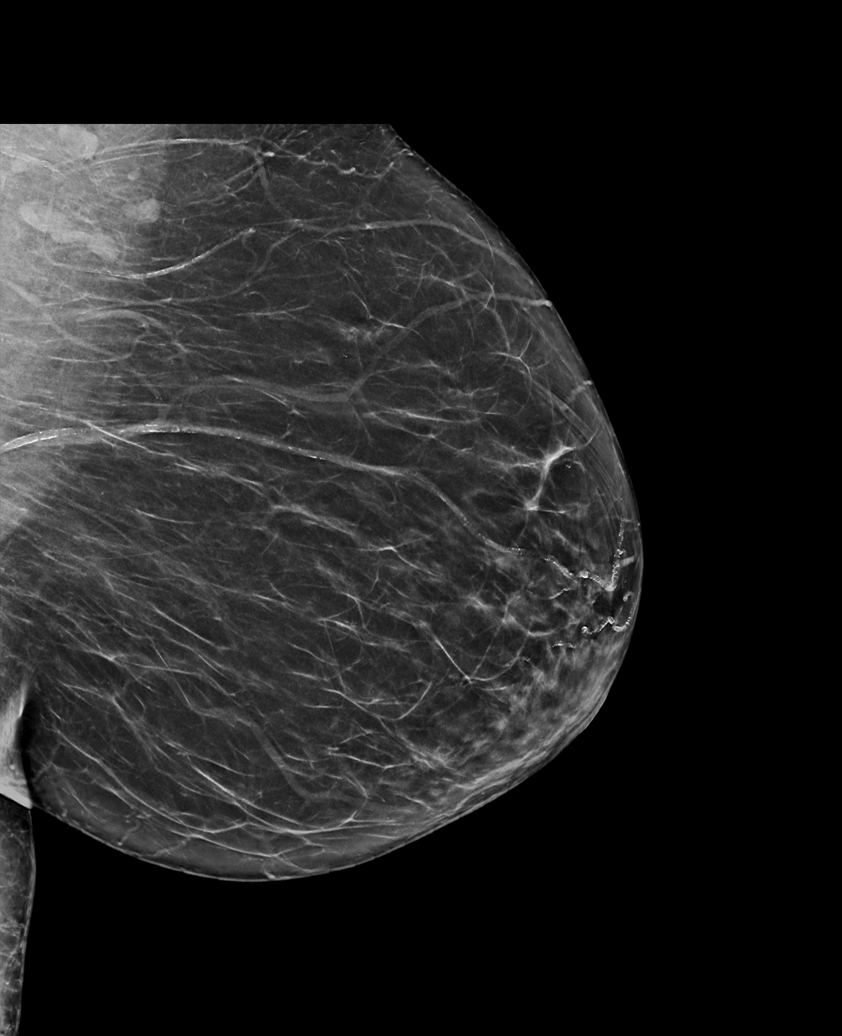

[R MLO synth-2D]
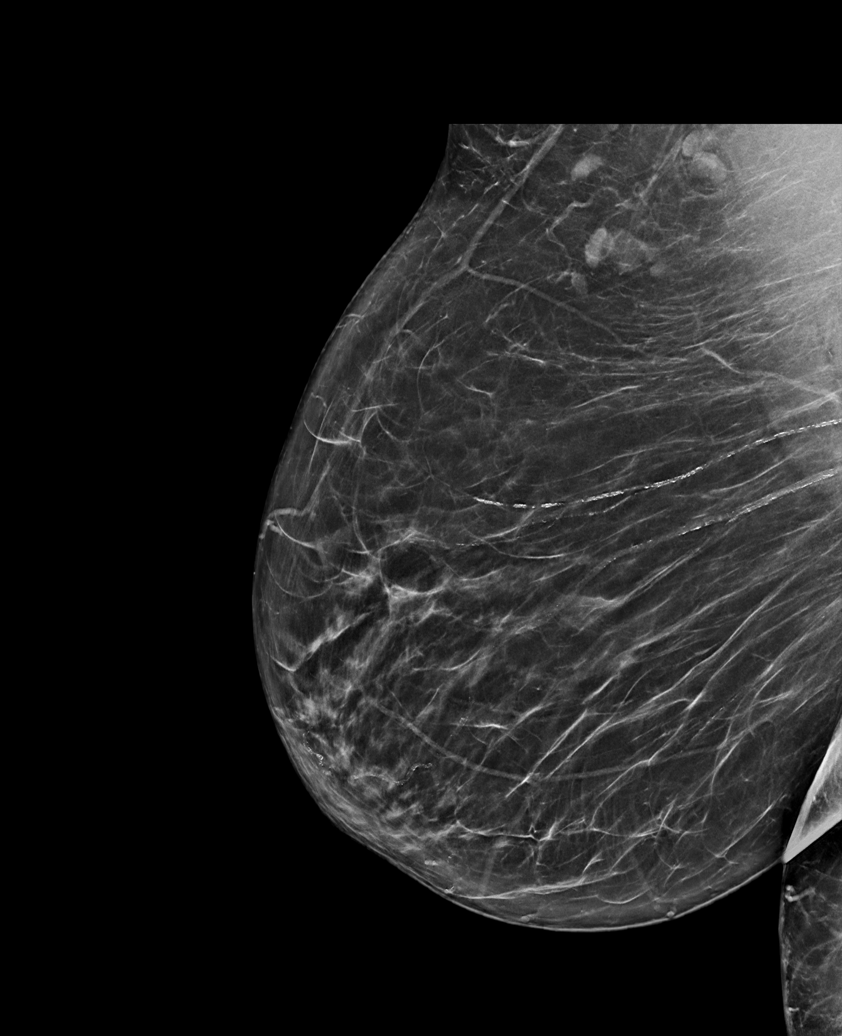

[R CC synth-2D]
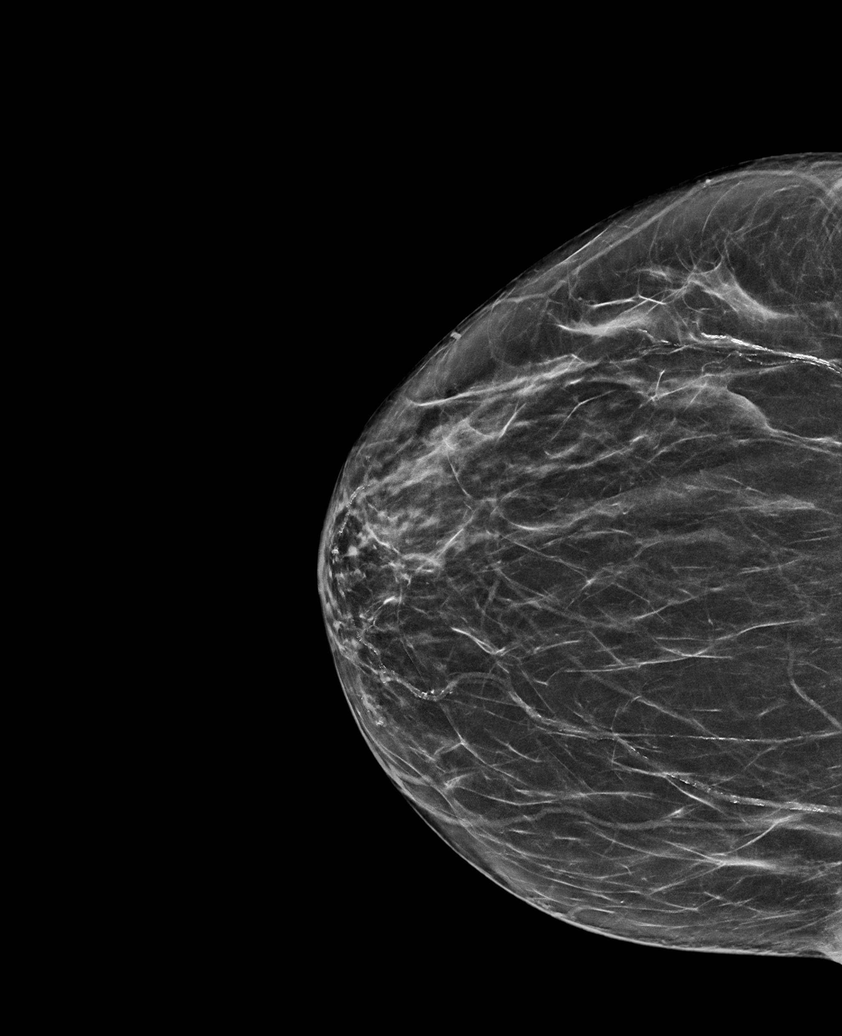

[L CC synth-2D (2 of 2)]
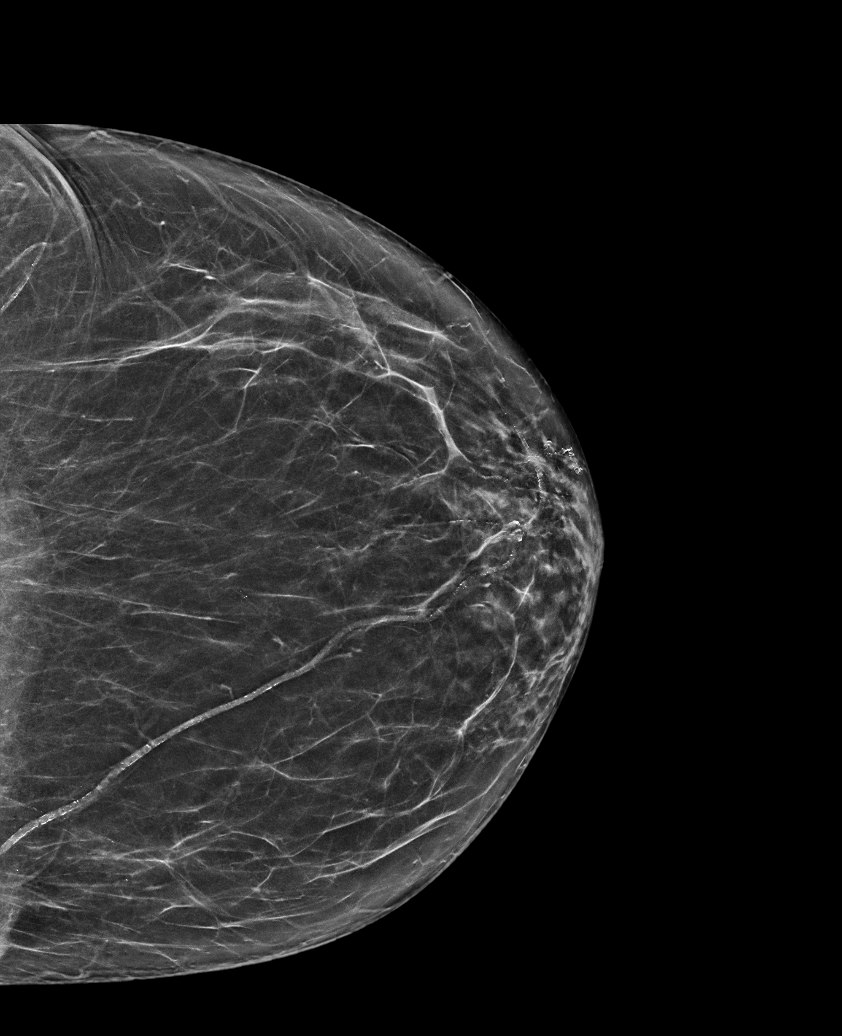

[L CC tomo · tomo slice 35/69.0]
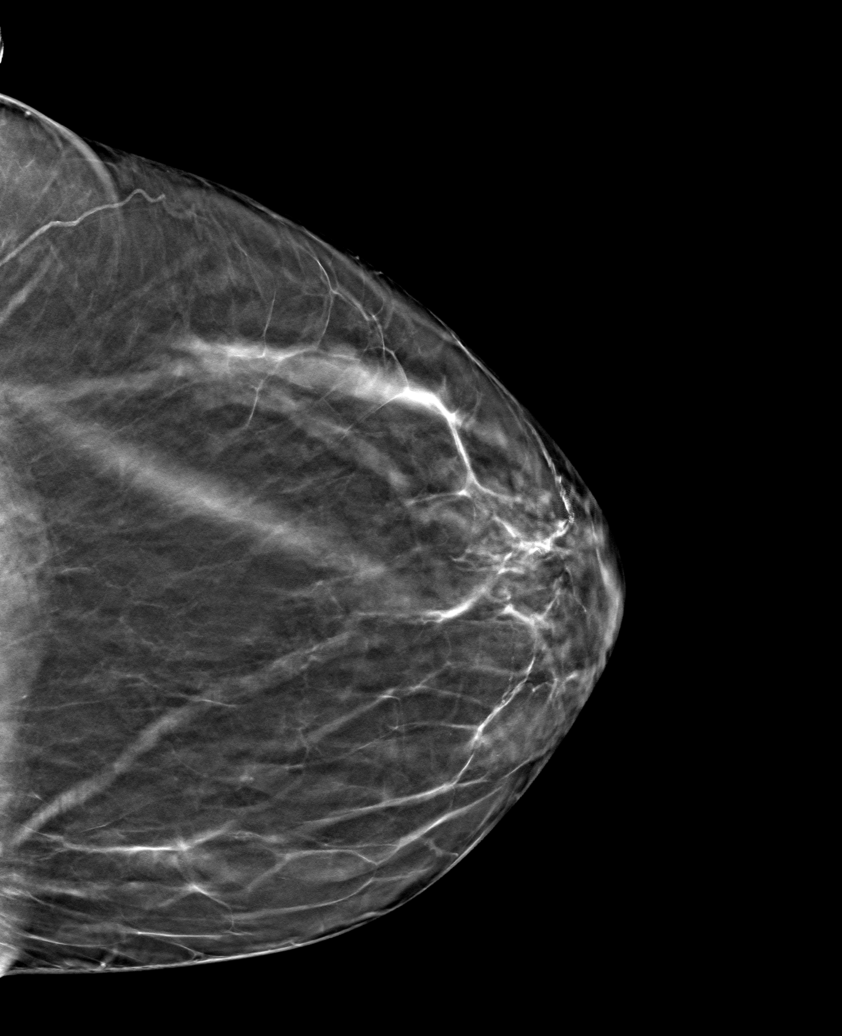

[6 of 30 positions shown; findings below may reference images not displayed]

ACR Breast Density Category b: There are scattered areas of
fibroglandular density.
FINDINGS: There are no findings suspicious for malignancy. Images were
processed with CAD.
IMPRESSION: No mammographic evidence of malignancy. A result letter of this
screening mammogram will be mailed directly to the patient.

RECOMMENDATION:
Screening mammogram in one year. (Code:[id number]-U-775)

BI-RADS CATEGORY  1: Negative.

## 2022-05-08 DIAGNOSIS — I10 Essential (primary) hypertension: Secondary | ICD-10-CM | POA: Diagnosis not present

## 2022-05-08 DIAGNOSIS — R7303 Prediabetes: Secondary | ICD-10-CM | POA: Diagnosis not present

## 2022-05-08 DIAGNOSIS — R609 Edema, unspecified: Secondary | ICD-10-CM | POA: Diagnosis not present

## 2022-06-13 ENCOUNTER — Ambulatory Visit (HOSPITAL_BASED_OUTPATIENT_CLINIC_OR_DEPARTMENT_OTHER): Payer: Medicare HMO | Admitting: Pulmonary Disease

## 2022-06-19 ENCOUNTER — Ambulatory Visit: Payer: Medicare HMO | Admitting: Adult Health

## 2022-07-02 ENCOUNTER — Ambulatory Visit
Admission: RE | Admit: 2022-07-02 | Discharge: 2022-07-02 | Disposition: A | Discharge status: Home / Self Care | Payer: Medicare HMO | Source: Ambulatory Visit | Attending: Family Medicine | Admitting: Family Medicine

## 2022-07-02 ENCOUNTER — Other Ambulatory Visit: Payer: Self-pay | Admitting: Family Medicine

## 2022-07-02 DIAGNOSIS — M25552 Pain in left hip: Secondary | ICD-10-CM | POA: Diagnosis not present

## 2022-07-02 DIAGNOSIS — M130 Polyarthritis, unspecified: Secondary | ICD-10-CM

## 2022-07-23 DIAGNOSIS — I10 Essential (primary) hypertension: Secondary | ICD-10-CM | POA: Diagnosis not present

## 2022-07-23 DIAGNOSIS — R7303 Prediabetes: Secondary | ICD-10-CM | POA: Diagnosis not present

## 2022-07-23 DIAGNOSIS — M130 Polyarthritis, unspecified: Secondary | ICD-10-CM | POA: Diagnosis not present

## 2022-08-27 ENCOUNTER — Ambulatory Visit (INDEPENDENT_AMBULATORY_CARE_PROVIDER_SITE_OTHER): Payer: Medicare HMO | Admitting: Pulmonary Disease

## 2022-08-27 ENCOUNTER — Encounter (HOSPITAL_BASED_OUTPATIENT_CLINIC_OR_DEPARTMENT_OTHER): Payer: Self-pay | Admitting: Pulmonary Disease

## 2022-08-27 VITALS — BP 124/82 | HR 88 | Resp 21 | Ht 63.0 in | Wt 193.4 lb

## 2022-08-27 DIAGNOSIS — J9612 Chronic respiratory failure with hypercapnia: Secondary | ICD-10-CM

## 2022-08-27 DIAGNOSIS — J9611 Chronic respiratory failure with hypoxia: Secondary | ICD-10-CM | POA: Diagnosis not present

## 2022-08-27 DIAGNOSIS — I10 Essential (primary) hypertension: Secondary | ICD-10-CM

## 2022-08-27 NOTE — Patient Instructions (Signed)
x check nocturnal oximetry on room air. Based on this we will decide whether you need oxygen during sleep

## 2022-08-27 NOTE — Progress Notes (Signed)
   Subjective:    Patient ID: Sherry Wood, female    DOB: 07-Dec-1940, 82 y.o.   MRN: [id number]  HPI  82 yo remote smoker for FU of chronic hypercarbic respiratory failure?  Etiology Presumptive diagnosis was chronic hypercarbic respiratory failure  She quit smoking in the 70s, less than 10 pack years  She was set up with NIV after an admission 01/2020 for hypoxic and hypercarbic respiratory failure ABG was 7.3 3/62/10 1/97% on 28% FiO2. 7.3 6/56/50 4/85% on room air  We discontinued NIV 12/2021 since she was unable to use.  We recheck nocturnal oximetry which showed significant desaturations.  But for some reason she has not been set up with oxygen yet. She has changed her PCP and antihypertensive was changed.  She is now on amlodipine. She reports leg swelling and uses furosemide. She reports some sleepiness in the daytime.  No bed partner history is available.  At any rate she does not want to use CPAP  Significant tests/ events reviewed   ABG 07/2020 (outpt) 7.41/46/81   01/2022 ONO /RA >> desaturation 2.5 hours less than 88%.   ONO/RA 08/03/20  Desat <88% for 4 hr 11 min <89% 5 hr 23 min  ONO on NIV/RA  showed desaturation for 2.5 hours >> ct O2   PFTs 05/2020 very mild airway obstruction, ratio 70, FEV1 1.28/88%, no bronchodilator response, normal TLC, normal DLCO   NPSG 07/2020 mild OSA, AHI 5.5/hour, nocturnal hypoxia >> 1 L oxygen was added HST 05/2020 >> AHI 10/hour, lowest desaturation 79%, she has been 94 minutes with saturation less than 89% CTA chest 02/07/20 neg PE Echo 01/2020 normal RV SF   Review of Systems neg for any significant sore throat, dysphagia, itching, sneezing, nasal congestion or excess/ purulent secretions, fever, chills, sweats, unintended wt loss, pleuritic or exertional cp, hempoptysis, orthopnea pnd or change in chronic leg swelling. Also denies presyncope, palpitations, heartburn, abdominal pain, nausea, vomiting, diarrhea or change in bowel or  urinary habits, dysuria,hematuria, rash, arthralgias, visual complaints, headache, numbness weakness or ataxia.     Objective:   Physical Exam  Gen. Pleasant, obese, in no distress ENT - no lesions, no post nasal drip Neck: No JVD, no thyromegaly, no carotid bruits Lungs: no use of accessory muscles, no dullness to percussion, decreased without rales or rhonchi  Cardiovascular: Rhythm regular, heart sounds  normal, no murmurs or gallops, 1+ peripheral edema Musculoskeletal: No deformities, no cyanosis or clubbing , no tremors       Assessment & Plan:

## 2022-08-27 NOTE — Assessment & Plan Note (Addendum)
She was unable to tolerate NIV during sleep.  Previous sleep studies have only shown minimal OSA. She does have nocturnal hypoxia.  We will reassess with nocturnal oximetry and if she has significant desaturations then try to get her nocturnal oxygen.  She is not interested in sleep studies or wearing CPAP

## 2022-08-27 NOTE — Assessment & Plan Note (Signed)
Well-controlled on amlodipine but this may be contributing to leg swelling

## 2022-09-13 ENCOUNTER — Other Ambulatory Visit: Payer: Self-pay

## 2022-09-13 ENCOUNTER — Encounter (HOSPITAL_COMMUNITY): Payer: Self-pay | Admitting: *Deleted

## 2022-09-13 ENCOUNTER — Ambulatory Visit (HOSPITAL_COMMUNITY)
Admission: EM | Admit: 2022-09-13 | Discharge: 2022-09-13 | Disposition: A | Discharge status: Home / Self Care | Payer: Medicare HMO | Attending: Internal Medicine | Admitting: Internal Medicine

## 2022-09-13 DIAGNOSIS — Z1152 Encounter for screening for COVID-19: Secondary | ICD-10-CM | POA: Insufficient documentation

## 2022-09-13 DIAGNOSIS — J069 Acute upper respiratory infection, unspecified: Secondary | ICD-10-CM | POA: Insufficient documentation

## 2022-09-13 MED ORDER — ALBUTEROL SULFATE HFA 108 (90 BASE) MCG/ACT IN AERS
INHALATION_SPRAY | RESPIRATORY_TRACT | Status: AC
  Filled 2022-09-13: qty 6.7

## 2022-09-13 MED ORDER — METHYLPREDNISOLONE 4 MG PO TBPK: ORAL_TABLET | ORAL | 0 refills | Status: AC

## 2022-09-13 MED ORDER — ALBUTEROL SULFATE HFA 108 (90 BASE) MCG/ACT IN AERS
2.0000 | INHALATION_SPRAY | Freq: Once | RESPIRATORY_TRACT | Status: AC
  Administered 2022-09-13: 2 via RESPIRATORY_TRACT

## 2022-09-13 MED ORDER — PROMETHAZINE-DM 6.25-15 MG/5ML PO SYRP: 5.0000 mL | ORAL_SOLUTION | Freq: Three times a day (TID) | ORAL | 0 refills | Status: AC | PRN

## 2022-09-13 NOTE — ED Provider Notes (Signed)
[facility]    CSN: [id number] Arrival date & time: 09/13/22  1642      History   Chief Complaint Chief Complaint  Patient presents with   Cough    HPI Sherry Wood is a 82 y.o. female.   82 year old female who presents to urgent care with a cough that has been ongoing since Sunday.  She reports she gets sweaty when she is coughing.  She relates that her cough is much worse at night and it has made it difficult for her to sleep.  She also relates some congestion especially at night.  She may have been exposed to COVID recently when she was at social services.  She denies definitive fever, chills, shortness of breath, nausea, vomiting, abdominal pain, change in appetite or other constitutional symptoms.  She does have an albuterol inhaler that she uses on occasion for asthma.    Cough Associated symptoms: fever (Feels sweaty with coughing)   Associated symptoms: no chest pain, no chills, no ear pain, no rash, no shortness of breath and no sore throat     Past Medical History:  Diagnosis Date   Asthma    Gout    Hypertension    Parotid swelling    Intermittent since 2017, referred to ENT, thought maybe sialiadenitis   Pre-diabetes     Patient Active Problem List   Diagnosis Date Noted   OSA (obstructive sleep apnea) 05/25/2020   Chronic respiratory failure with hypoxia and hypercapnia ([facility]) 04/13/2020   Essential hypertension 02/07/2020   Pre-syncope 02/07/2020   Hypokalemia 02/07/2020   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  Authorizing Provider  albuterol (VENTOLIN HFA) 108 (90 Base) MCG/ACT inhaler Inhale 1-2 puffs into the lungs every 6 (six) hours as needed for wheezing or shortness of breath.   Yes [provider]  amLODipine (NORVASC) 5 MG tablet Take by mouth daily.   Yes [provider]  furosemide (LASIX) 20 MG tablet Take 20 mg by mouth daily. 12/13/15  Yes [provider]  azithromycin (ZITHROMAX) 250 MG tablet Take 1 tablet (250 mg total) by mouth daily. Take first 2 tablets together, then 1 every day until finished. 03/08/22   Jannifer Franklin, MD  calcium-vitamin D (OSCAL WITH D) 500-200 MG-UNIT tablet Take 1 tablet by mouth daily with breakfast. Patient not taking: Reported on 08/27/2022    [provider]    Family History Family History  Problem Relation Age of Onset   Diabetes Mother    Cancer Mother    Asthma Sister    Cancer Sister        Bone   Hypertension Sister    Diabetes Sister     Social History Social History   Tobacco Use  Smoking status: Former    Current packs/day: 0.00    Types: Cigarettes    Start date: 24    Quit date: 1972    Years since quitting: 52.7   Smokeless tobacco: Never   Tobacco comments:    1-2 cigarettes a day  Vaping Use   Vaping status: Never Used  Substance Use Topics   Alcohol use: No   Drug use: No     Allergies   Aloe vera, Latex, Naproxen, and Penicillins   Review of Systems Review of Systems  Constitutional:  Positive for fever (Feels sweaty with coughing). Negative for appetite change and chills.  HENT:  Positive for congestion (only at night). Negative for ear pain and sore throat.   Eyes:  Negative for pain and visual disturbance.  Respiratory:  Positive for cough (Especially at night). Negative for shortness of breath.   Cardiovascular:  Negative for chest pain and palpitations.  Gastrointestinal:  Negative for abdominal pain and vomiting.  Genitourinary:  Negative for dysuria and hematuria.   Musculoskeletal:  Negative for arthralgias and back pain.  Skin:  Negative for color change and rash.  Neurological:  Negative for seizures and syncope.  All other systems reviewed and are negative.    Physical Exam Triage Vital Signs ED Triage Vitals  Encounter Vitals Group     BP 09/13/22 1823 (!) 158/94     Systolic BP Percentile --      Diastolic BP Percentile --      Pulse Rate 09/13/22 1823 73     Resp 09/13/22 1823 18     Temp 09/13/22 1823 98.1 F (36.7 C)     Temp src --      SpO2 09/13/22 1823 97 %     Weight --      Height --      Head Circumference --      Peak Flow --      Pain Score 09/13/22 1821 0     Pain Loc --      Pain Education --      Exclude from Growth Chart --    No data found.  Updated Vital Signs BP (!) 158/94   Pulse 73   Temp 98.1 F (36.7 C)   Resp 18   SpO2 97%   Visual Acuity Right Eye Distance:   Left Eye Distance:   Bilateral Distance:    Right Eye Near:   Left Eye Near:    Bilateral Near:     Physical Exam Vitals and nursing note reviewed.  Constitutional:      General: She is not in acute distress.    Appearance: She is well-developed.  HENT:     Head: Normocephalic and atraumatic.     Right Ear: Tympanic membrane normal.     Left Ear: Tympanic membrane normal.     Nose: No congestion.     Mouth/Throat:     Mouth: Mucous membranes are moist.     Pharynx: No posterior oropharyngeal erythema.  Eyes:     Conjunctiva/sclera: Conjunctivae normal.  Cardiovascular:     Rate and Rhythm: Normal rate and regular rhythm.     Heart sounds: No murmur heard. Pulmonary:     Effort: Pulmonary effort is normal. No respiratory distress.     Breath sounds: Wheezing present.  Abdominal:     Palpations: Abdomen is soft.     Tenderness: There is no abdominal tenderness.  Musculoskeletal:        General: No  swelling.     Cervical back: Neck supple.  Skin:    General: Skin is warm and dry.     Capillary Refill: Capillary refill  takes less than 2 seconds.  Neurological:     Mental Status: She is alert.  Psychiatric:        Mood and Affect: Mood normal.      UC Treatments / Results  Labs (all labs ordered are listed, but only abnormal results are displayed) Labs Reviewed - No data to display  EKG   Radiology No results found.  Procedures Procedures (including critical care time)  Medications Ordered in UC Medications - No data to display  Initial Impression / Assessment and Plan / UC Course  I have reviewed the triage vital signs and the nursing notes.  Pertinent labs & imaging results that were available during my care of the patient were reviewed by me and considered in my medical decision making (see chart for details).     Viral upper respiratory tract infection with cough: Patient does have some wheezing present that resolves after albuterol inhaler.  Recommend that she uses this every 6 hours as needed for wheezing.  Will give her a steroid taper for possible bronchitis.  We have given her medication for cough but recommend that she use this sparingly as it can cause sedation.  The patient should follow-up here should her symptoms worsen or fail to improve. Final Clinical Impressions(s) / UC Diagnoses   Final diagnoses:  None   Discharge Instructions   None    ED Prescriptions   None    PDMP not reviewed this encounter.   Landis Martins, [place] 09/13/22 1920

## 2022-09-13 NOTE — Discharge Instructions (Addendum)
Start steroid dose pack as directed on the package. May use the promethazine-dm 3 times daily as needed for cough. Albuterol inhaler every 6 hours as needed for wheezing.   Return to the clinic if symptoms worsen or fail to resolve.

## 2022-09-13 NOTE — ED Triage Notes (Signed)
Pt reports she has a cough since Sunday and is now worse.

## 2022-09-14 LAB — SARS CORONAVIRUS 2 (TAT 6-24 HRS): SARS Coronavirus 2: NEGATIVE

## 2022-10-17 DIAGNOSIS — Z961 Presence of intraocular lens: Secondary | ICD-10-CM | POA: Diagnosis not present

## 2022-10-17 DIAGNOSIS — H40023 Open angle with borderline findings, high risk, bilateral: Secondary | ICD-10-CM | POA: Diagnosis not present

## 2022-10-22 DIAGNOSIS — Z6833 Body mass index (BMI) 33.0-33.9, adult: Secondary | ICD-10-CM | POA: Diagnosis not present

## 2022-10-22 DIAGNOSIS — I10 Essential (primary) hypertension: Secondary | ICD-10-CM | POA: Diagnosis not present

## 2022-10-22 DIAGNOSIS — Z23 Encounter for immunization: Secondary | ICD-10-CM | POA: Diagnosis not present

## 2022-10-22 DIAGNOSIS — M130 Polyarthritis, unspecified: Secondary | ICD-10-CM | POA: Diagnosis not present

## 2022-10-22 DIAGNOSIS — E669 Obesity, unspecified: Secondary | ICD-10-CM | POA: Diagnosis not present

## 2022-10-22 DIAGNOSIS — F413 Other mixed anxiety disorders: Secondary | ICD-10-CM | POA: Diagnosis not present

## 2022-10-22 DIAGNOSIS — E782 Mixed hyperlipidemia: Secondary | ICD-10-CM | POA: Diagnosis not present

## 2022-10-22 DIAGNOSIS — R609 Edema, unspecified: Secondary | ICD-10-CM | POA: Diagnosis not present

## 2022-10-22 DIAGNOSIS — R7303 Prediabetes: Secondary | ICD-10-CM | POA: Diagnosis not present

## 2022-11-22 DIAGNOSIS — R609 Edema, unspecified: Secondary | ICD-10-CM | POA: Diagnosis not present

## 2022-11-22 DIAGNOSIS — E1169 Type 2 diabetes mellitus with other specified complication: Secondary | ICD-10-CM | POA: Diagnosis not present

## 2022-12-18 DIAGNOSIS — F413 Other mixed anxiety disorders: Secondary | ICD-10-CM | POA: Diagnosis not present

## 2022-12-18 DIAGNOSIS — I10 Essential (primary) hypertension: Secondary | ICD-10-CM | POA: Diagnosis not present

## 2022-12-18 DIAGNOSIS — N390 Urinary tract infection, site not specified: Secondary | ICD-10-CM | POA: Diagnosis not present

## 2022-12-18 DIAGNOSIS — G4730 Sleep apnea, unspecified: Secondary | ICD-10-CM | POA: Diagnosis not present

## 2022-12-18 DIAGNOSIS — R609 Edema, unspecified: Secondary | ICD-10-CM | POA: Diagnosis not present

## 2022-12-18 DIAGNOSIS — N3281 Overactive bladder: Secondary | ICD-10-CM | POA: Diagnosis not present

## 2022-12-18 DIAGNOSIS — Z0000 Encounter for general adult medical examination without abnormal findings: Secondary | ICD-10-CM | POA: Diagnosis not present

## 2022-12-18 DIAGNOSIS — M130 Polyarthritis, unspecified: Secondary | ICD-10-CM | POA: Diagnosis not present

## 2022-12-18 DIAGNOSIS — N760 Acute vaginitis: Secondary | ICD-10-CM | POA: Diagnosis not present

## 2023-01-08 DIAGNOSIS — Z1231 Encounter for screening mammogram for malignant neoplasm of breast: Secondary | ICD-10-CM | POA: Diagnosis not present

## 2023-01-22 DIAGNOSIS — E7800 Pure hypercholesterolemia, unspecified: Secondary | ICD-10-CM | POA: Diagnosis not present

## 2023-01-22 DIAGNOSIS — N390 Urinary tract infection, site not specified: Secondary | ICD-10-CM | POA: Diagnosis not present

## 2023-01-22 DIAGNOSIS — I10 Essential (primary) hypertension: Secondary | ICD-10-CM | POA: Diagnosis not present

## 2023-01-22 DIAGNOSIS — E1169 Type 2 diabetes mellitus with other specified complication: Secondary | ICD-10-CM | POA: Diagnosis not present

## 2023-04-22 DIAGNOSIS — I10 Essential (primary) hypertension: Secondary | ICD-10-CM | POA: Diagnosis not present

## 2023-04-22 DIAGNOSIS — R5383 Other fatigue: Secondary | ICD-10-CM | POA: Diagnosis not present

## 2023-04-22 DIAGNOSIS — E1169 Type 2 diabetes mellitus with other specified complication: Secondary | ICD-10-CM | POA: Diagnosis not present

## 2023-04-22 DIAGNOSIS — R600 Localized edema: Secondary | ICD-10-CM | POA: Diagnosis not present

## 2023-04-22 DIAGNOSIS — E782 Mixed hyperlipidemia: Secondary | ICD-10-CM | POA: Diagnosis not present

## 2023-05-13 DIAGNOSIS — I10 Essential (primary) hypertension: Secondary | ICD-10-CM | POA: Diagnosis not present

## 2023-05-13 DIAGNOSIS — R7303 Prediabetes: Secondary | ICD-10-CM | POA: Diagnosis not present

## 2023-05-13 DIAGNOSIS — R5383 Other fatigue: Secondary | ICD-10-CM | POA: Diagnosis not present

## 2023-05-13 DIAGNOSIS — R609 Edema, unspecified: Secondary | ICD-10-CM | POA: Diagnosis not present

## 2023-05-22 DIAGNOSIS — Z961 Presence of intraocular lens: Secondary | ICD-10-CM | POA: Diagnosis not present

## 2023-05-22 DIAGNOSIS — H40023 Open angle with borderline findings, high risk, bilateral: Secondary | ICD-10-CM | POA: Diagnosis not present

## 2023-06-13 DIAGNOSIS — R609 Edema, unspecified: Secondary | ICD-10-CM | POA: Diagnosis not present

## 2023-06-13 DIAGNOSIS — I10 Essential (primary) hypertension: Secondary | ICD-10-CM | POA: Diagnosis not present

## 2023-07-15 DIAGNOSIS — R7309 Other abnormal glucose: Secondary | ICD-10-CM | POA: Diagnosis not present

## 2023-07-15 DIAGNOSIS — R609 Edema, unspecified: Secondary | ICD-10-CM | POA: Diagnosis not present

## 2023-07-15 DIAGNOSIS — I10 Essential (primary) hypertension: Secondary | ICD-10-CM | POA: Diagnosis not present

## 2023-08-27 DIAGNOSIS — I10 Essential (primary) hypertension: Secondary | ICD-10-CM | POA: Diagnosis not present

## 2023-08-27 DIAGNOSIS — R609 Edema, unspecified: Secondary | ICD-10-CM | POA: Diagnosis not present

## 2023-08-27 DIAGNOSIS — E1169 Type 2 diabetes mellitus with other specified complication: Secondary | ICD-10-CM | POA: Diagnosis not present

## 2023-10-03 ENCOUNTER — Ambulatory Visit (HOSPITAL_BASED_OUTPATIENT_CLINIC_OR_DEPARTMENT_OTHER): Admitting: Pulmonary Disease

## 2023-11-06 ENCOUNTER — Encounter: Payer: Self-pay | Admitting: *Deleted

## 2023-11-06 NOTE — Progress Notes (Signed)
 ORVILLE WIDMANN                                          MRN: [id number]   11/06/2023   The [facility] Quality Team Specialist reviewed this patient medical record for the purposes of chart review for care gap closure. The following were reviewed: chart review for care gap closure-kidney health evaluation for diabetes:eGFR  and uACR.    [facility] Quality Team

## 2023-11-21 ENCOUNTER — Ambulatory Visit (HOSPITAL_BASED_OUTPATIENT_CLINIC_OR_DEPARTMENT_OTHER): Admitting: Pulmonary Disease
